# Patient Record
Sex: Male | Born: 1965 | Race: White | Hispanic: No | Marital: Married
Health system: Midwestern US, Community
[De-identification: ages and names within clinical notes are randomized; demographics above are authoritative.]

## PROBLEM LIST (undated history)

## (undated) DIAGNOSIS — I10 Essential (primary) hypertension: Secondary | ICD-10-CM

## (undated) DIAGNOSIS — G4730 Sleep apnea, unspecified: Secondary | ICD-10-CM

## (undated) DIAGNOSIS — E669 Obesity, unspecified: Secondary | ICD-10-CM

## (undated) DIAGNOSIS — E785 Hyperlipidemia, unspecified: Secondary | ICD-10-CM

## (undated) DIAGNOSIS — R739 Hyperglycemia, unspecified: Secondary | ICD-10-CM

## (undated) DIAGNOSIS — G4733 Obstructive sleep apnea (adult) (pediatric): Secondary | ICD-10-CM

## (undated) DIAGNOSIS — M1990 Unspecified osteoarthritis, unspecified site: Secondary | ICD-10-CM

## (undated) DIAGNOSIS — Z96651 Presence of right artificial knee joint: Secondary | ICD-10-CM

## (undated) DIAGNOSIS — M1711 Unilateral primary osteoarthritis, right knee: Secondary | ICD-10-CM

## (undated) HISTORY — DX: Essential (primary) hypertension: I10

## (undated) HISTORY — DX: Hyperglycemia, unspecified: R73.9

## (undated) HISTORY — PX: OTHER SURGICAL HISTORY: SHX169

## (undated) HISTORY — PX: TIBIA OSTEOTOMY: SHX1065

## (undated) HISTORY — DX: Hyperlipidemia, unspecified: E78.5

## (undated) HISTORY — PX: VASECTOMY: SHX75

## (undated) HISTORY — PX: APPENDECTOMY: SHX54

## (undated) HISTORY — PX: HIGH TIBIAL OSTEOTOMY: SHX667

## (undated) HISTORY — DX: Obstructive sleep apnea (adult) (pediatric): G47.33

## (undated) HISTORY — DX: Obesity, unspecified: E66.9

---

## 2003-12-23 ENCOUNTER — Emergency Department (HOSPITAL_COMMUNITY): Admission: EM | Admit: 2003-12-23 | Discharge: 2003-12-24 | Payer: Self-pay | Admitting: Emergency Medicine

## 2014-05-08 ENCOUNTER — Encounter: Payer: Self-pay | Admitting: *Deleted

## 2014-05-08 ENCOUNTER — Encounter: Payer: Self-pay | Admitting: Cardiovascular Disease

## 2014-05-08 ENCOUNTER — Ambulatory Visit (INDEPENDENT_AMBULATORY_CARE_PROVIDER_SITE_OTHER): Payer: BC Managed Care – PPO | Admitting: Cardiovascular Disease

## 2014-05-08 VITALS — BP 130/90 | HR 91 | Ht 73.0 in | Wt 286.0 lb

## 2014-05-08 DIAGNOSIS — E785 Hyperlipidemia, unspecified: Secondary | ICD-10-CM | POA: Insufficient documentation

## 2014-05-08 DIAGNOSIS — I10 Essential (primary) hypertension: Secondary | ICD-10-CM

## 2014-05-08 DIAGNOSIS — M1990 Unspecified osteoarthritis, unspecified site: Secondary | ICD-10-CM | POA: Insufficient documentation

## 2014-05-08 DIAGNOSIS — Z01810 Encounter for preprocedural cardiovascular examination: Secondary | ICD-10-CM

## 2014-05-08 DIAGNOSIS — E662 Morbid (severe) obesity with alveolar hypoventilation: Secondary | ICD-10-CM

## 2014-05-08 DIAGNOSIS — Z01818 Encounter for other preprocedural examination: Secondary | ICD-10-CM

## 2014-05-08 NOTE — Patient Instructions (Signed)
Your physician recommends that you schedule a follow-up appointment in: AS NEEDED  Your physician recommends that you continue on your current medications as directed. Please refer to the Current Medication list given to you today.  

## 2014-05-08 NOTE — Assessment & Plan Note (Signed)
Well controlled.  Continue current medications and low sodium Dash type diet.    

## 2014-05-08 NOTE — Assessment & Plan Note (Signed)
Base on guidelines for moderate risk surgery no testing needed.  Asymptomatic, normal exam and ECG Clear to have left TKR with Dr Charlann Boxerlin

## 2014-05-08 NOTE — Assessment & Plan Note (Signed)
Cholesterol is at goal.  Continue current dose of statin and diet Rx.  No myalgias or side effects.  F/U  LFT's in 6 months. No results found for this basename: LDLCALC  [facility]

## 2014-05-08 NOTE — Progress Notes (Signed)
Patient ID: Roberto ShutterChris Kim, male   DOB: 05/22/1966, 48 y.o.   MRN: [id number]    48 yo referred by Dr Charlann Boxerlin for preop clearence .  He has no history of arrhythmia, CAD, or CHF.  He has longstanding well Rx HTN and elevated lipids.  He is overweight and sedentary.  He has sleep apnea but does not where CPAP.  Has had surgeries before including appendectomy with no anesthetic or bleeding issues.  Compliant with meds No chest pain Functional dyspnea  Non smoker.  Works as a Nurse, [employer] plant manager.  Left knee has been bad for years and right also arthritic      ROS: Denies fever, malais, weight loss, blurry vision, decreased visual acuity, cough, sputum, SOB, hemoptysis, pleuritic pain, palpitaitons, heartburn, abdominal pain, melena, lower extremity edema, claudication, or rash.  All other systems reviewed and negative   General: Affect appropriate Healthy:  appears stated age HEENT: normal Neck supple with no adenopathy JVP normal no bruits no thyromegaly Lungs clear with no wheezing and good diaphragmatic motion Heart:  S1/S2 no murmur,rub, gallop or click PMI normal Abdomen: benighn, BS positve, no tenderness, no AAA no bruit.  No HSM or HJR Distal pulses intact with no bruits No edema Neuro non-focal Skin warm and dry No muscular weakness  Medications Current Outpatient Prescriptions  Medication Sig Dispense Refill  . CRESTOR 10 MG tablet Take 10 mg by mouth daily.      Marland Kitchen. losartan (COZAAR) 50 MG tablet Take 50 mg by mouth daily.      . naproxen sodium (ANAPROX) 220 MG tablet Take 440 mg by mouth.      . predniSONE (STERAPRED UNI-PAK) 5 MG TABS tablet Take 5 mg by mouth daily.      . sildenafil (REVATIO) 20 MG tablet Take 40 mg by mouth. As needed       No current facility-administered medications for this visit.    Allergies Ace inhibitors and Statins  Family History: Family History  Problem Relation Age of Onset  . Hypertension Mother   . Hypertension Father     Social  History: History   Social History  . Marital Status: Married    Spouse Name: N/A    Number of Children: N/A  . Years of Education: N/A   Occupational History  . Not on file.   Social History Main Topics  . Smoking status: Never Smoker   . Smokeless tobacco: Not on file  . Alcohol Use: Not on file  . Drug Use: Not on file  . Sexual Activity: Not on file   Other Topics Concern  . Not on file   Social History Narrative  . No narrative on file    Electrocardiogram:  NSR normal ECG   Assessment and Plan

## 2014-05-08 NOTE — Assessment & Plan Note (Signed)
Discussed portion control and low carb diet.  Exercise program post TKR  Issues of apnea and heart arrhythmia discussed f/u primary regarding CPAP although patient pretty clear he will not wear it

## 2014-05-14 ENCOUNTER — Encounter (HOSPITAL_COMMUNITY): Payer: Self-pay | Admitting: Pharmacy Technician

## 2014-05-14 NOTE — Patient Instructions (Addendum)
Roberto CapriceChristopher Kim  05/14/2014   Your procedure is scheduled on:  05/21/2014    Come thru the [facility] Entr[facility].   Follow the Signs to Short Stay Center at  0700      am  Call this number if you have problems the morning of surgery: [phone]   Remember:   Do not eat food or drink liquids after midnight.   Take these medicines the morning of surgery with A SIP OF WATER: Allergy eye drops if needed    Do not wear jewelry,   Do not wear lotions, powders, or perfumes.  deodorant.  . Men may shave face and neck.  Do not bring valuables to the hospital.  Contacts, dentures or bridgework may not be worn into surgery.  Leave suitcase in the car. After surgery it may be brought to your room.  For patients admitted to the hospital, checkout time is 11:00 AM the day of  discharge.       Please read over the following fact sheets that you were given: MRSA Information, coughing and deep breathing exercises, leg exercises            Roberto Kim - Preparing for Surgery Before surgery, you can play an important role.  Because skin is not sterile, your skin needs to be as free of germs as possible.  You can reduce the number of germs on your skin by washing with CHG (chlorahexidine gluconate) soap before surgery.  CHG is an antiseptic cleaner which kills germs and bonds with the skin to continue killing germs even after washing. Please DO NOT use if you have an allergy to CHG or antibacterial soaps.  If your skin becomes reddened/irritated stop using the CHG and inform your nurse when you arrive at Short Stay. Do not shave (including legs and underarms) for at least 48 hours prior to the first CHG shower.  You may shave your face/neck. Please follow these instructions carefully:  1.  Shower with CHG Soap the night before surgery and the  morning of Surgery.  2.  If you choose to wash your hair, wash your hair first as usual with your  normal  shampoo.  3.  After you shampoo, rinse your hair and  body thoroughly to remove the  shampoo.                           4.  Use CHG as you would any other liquid soap.  You can apply chg directly  to the skin and wash                       Gently with a scrungie or clean washcloth.  5.  Apply the CHG Soap to your body ONLY FROM THE NECK DOWN.   Do not use on face/ open                           Wound or open sores. Avoid contact with eyes, ears mouth and genitals (private parts).                       Wash face,  Genitals (private parts) with your normal soap.             6.  Wash thoroughly, paying special attention to the area where your surgery  will be performed.  7.  Thoroughly rinse your  body with warm water from the neck down.  8.  DO NOT shower/wash with your normal soap after using and rinsing off  the CHG Soap.                9.  Pat yourself dry with a clean towel.            10.  Wear clean pajamas.            11.  Place clean sheets on your bed the night of your first shower and do not  sleep with pets. Day of Surgery : Do not apply any lotions/deodorants the morning of surgery.  Please wear clean clothes to the hospital/surgery center.  FAILURE TO FOLLOW THESE INSTRUCTIONS MAY RESULT IN THE CANCELLATION OF YOUR SURGERY PATIENT SIGNATURE_________________________________  NURSE SIGNATURE__________________________________  ________________________________________________________________________  WHAT IS A BLOOD TRANSFUSION? Blood Transfusion Information  A transfusion is the replacement of blood or some of its parts. Blood is made up of multiple cells which provide different functions.  Red blood cells carry oxygen and are used for blood loss replacement.  White blood cells fight against infection.  Platelets control bleeding.  Plasma helps clot blood.  Other blood products are available for specialized needs, such as hemophilia or other clotting disorders. BEFORE THE TRANSFUSION  Who gives blood for transfusions?    Healthy volunteers who are fully evaluated to make sure their blood is safe. This is blood bank blood. Transfusion therapy is the safest it has ever been in the practice of medicine. Before blood is taken from a donor, a complete history is taken to make sure that person has no history of diseases nor engages in risky social behavior (examples are intravenous drug use or sexual activity with multiple partners). The donor's travel history is screened to minimize risk of transmitting infections, such as malaria. The donated blood is tested for signs of infectious diseases, such as HIV and hepatitis. The blood is then tested to be sure it is compatible with you in order to minimize the chance of a transfusion reaction. If you or a relative donates blood, this is often done in anticipation of surgery and is not appropriate for emergency situations. It takes many days to process the donated blood. RISKS AND COMPLICATIONS Although transfusion therapy is very safe and saves many lives, the main dangers of transfusion include:   Getting an infectious disease.  Developing a transfusion reaction. This is an allergic reaction to something in the blood you were given. Every precaution is taken to prevent this. The decision to have a blood transfusion has been considered carefully by your caregiver before blood is given. Blood is not given unless the benefits outweigh the risks. AFTER THE TRANSFUSION  Right after receiving a blood transfusion, you will usually feel much better and more energetic. This is especially true if your red blood cells have gotten low (anemic). The transfusion raises the level of the red blood cells which carry oxygen, and this usually causes an energy increase.  The nurse administering the transfusion will monitor you carefully for complications. HOME CARE INSTRUCTIONS  No special instructions are needed after a transfusion. You may find your energy is better. Speak with your  caregiver about any limitations on activity for underlying diseases you may have. SEEK MEDICAL CARE IF:   Your condition is not improving after your transfusion.  You develop redness or irritation at the intravenous (IV) site. SEEK IMMEDIATE MEDICAL CARE IF:  Any of the following symptoms  occur over the next 12 hours:  Shaking chills.  You have a temperature by mouth above 102 F (38.9 C), not controlled by medicine.  Chest, back, or muscle pain.  People around you feel you are not acting correctly or are confused.  Shortness of breath or difficulty breathing.  Dizziness and fainting.  You get a rash or develop hives.  You have a decrease in urine output.  Your urine turns a dark color or changes to pink, red, or brown. Any of the following symptoms occur over the next 10 days:  You have a temperature by mouth above 102 F (38.9 C), not controlled by medicine.  Shortness of breath.  Weakness after normal activity.  The white part of the eye turns yellow (jaundice).  You have a decrease in the amount of urine or are urinating less often.  Your urine turns a dark color or changes to pink, red, or brown. Document Released: 07/02/2000 Document Revised: 09/27/2011 Document Reviewed: 02/19/2008 ExitCare Patient Information 2014 [place], [place].  _______________________________________________________________________  Incentive Spirometer  An incentive spirometer is a tool that can help keep your lungs clear and active. This tool measures how well you are filling your lungs with each breath. Taking long deep breaths may help reverse or decrease the chance of developing breathing (pulmonary) problems (especially infection) following:  A long period of time when you are unable to move or be active. BEFORE THE PROCEDURE   If the spirometer includes an indicator to show your best effort, your nurse or respiratory therapist will set it to a desired goal.  If possible, sit  up straight or lean slightly forward. Try not to slouch.  Hold the incentive spirometer in an upright position. INSTRUCTIONS FOR USE  1. Sit on the edge of your bed if possible, or sit up as far as you can in bed or on a chair. 2. Hold the incentive spirometer in an upright position. 3. Breathe out normally. 4. Place the mouthpiece in your mouth and seal your lips tightly around it. 5. Breathe in slowly and as deeply as possible, raising the piston or the ball toward the top of the column. 6. Hold your breath for 3-5 seconds or for as long as possible. Allow the piston or ball to fall to the bottom of the column. 7. Remove the mouthpiece from your mouth and breathe out normally. 8. Rest for a few seconds and repeat Steps 1 through 7 at least 10 times every 1-2 hours when you are awake. Take your time and take a few normal breaths between deep breaths. 9. The spirometer may include an indicator to show your best effort. Use the indicator as a goal to work toward during each repetition. 10. After each set of 10 deep breaths, practice coughing to be sure your lungs are clear. If you have an incision (the cut made at the time of surgery), support your incision when coughing by placing a pillow or rolled up towels firmly against it. Once you are able to get out of bed, walk around indoors and cough well. You may stop using the incentive spirometer when instructed by your caregiver.  RISKS AND COMPLICATIONS  Take your time so you do not get dizzy or light-headed.  If you are in pain, you may need to take or ask for pain medication before doing incentive spirometry. It is harder to take a deep breath if you are having pain. AFTER USE  Rest and breathe slowly and easily.  It  can be helpful to keep track of a log of your progress. Your caregiver can provide you with a simple table to help with this. If you are using the spirometer at home, follow these instructions: SEEK MEDICAL CARE IF:   You are  having difficultly using the spirometer.  You have trouble using the spirometer as often as instructed.  Your pain medication is not giving enough relief while using the spirometer.  You develop fever of 100.5 F (38.1 C) or higher. SEEK IMMEDIATE MEDICAL CARE IF:   You cough up bloody sputum that had not been present before.  You develop fever of 102 F (38.9 C) or greater.  You develop worsening pain at or near the incision site. MAKE SURE YOU:   Understand these instructions.  Will watch your condition.  Will get help right away if you are not doing well or get worse. Document Released: 11/15/2006 Document Revised: 09/27/2011 Document Reviewed: 01/16/2007 [facility] Patient Information 2014 [place], [place].   ________________________________________________________________________

## 2014-05-15 ENCOUNTER — Encounter (HOSPITAL_COMMUNITY)
Admission: RE | Admit: 2014-05-15 | Discharge: 2014-05-15 | Disposition: A | Discharge status: Home / Self Care | Payer: BC Managed Care – PPO | Source: Ambulatory Visit | Attending: Orthopedic Surgery | Admitting: Orthopedic Surgery

## 2014-05-15 ENCOUNTER — Encounter (HOSPITAL_COMMUNITY): Payer: Self-pay

## 2014-05-15 ENCOUNTER — Ambulatory Visit (HOSPITAL_COMMUNITY)
Admission: RE | Admit: 2014-05-15 | Discharge: 2014-05-15 | Disposition: A | Discharge status: Home / Self Care | Payer: BC Managed Care – PPO | Source: Ambulatory Visit | Attending: Orthopedic Surgery | Admitting: Orthopedic Surgery

## 2014-05-15 ENCOUNTER — Encounter (INDEPENDENT_AMBULATORY_CARE_PROVIDER_SITE_OTHER): Payer: Self-pay

## 2014-05-15 DIAGNOSIS — I10 Essential (primary) hypertension: Secondary | ICD-10-CM | POA: Diagnosis not present

## 2014-05-15 DIAGNOSIS — Z01810 Encounter for preprocedural cardiovascular examination: Secondary | ICD-10-CM | POA: Insufficient documentation

## 2014-05-15 HISTORY — DX: Essential (primary) hypertension: I10

## 2014-05-15 HISTORY — DX: Sleep apnea, unspecified: G47.30

## 2014-05-15 HISTORY — DX: Unspecified osteoarthritis, unspecified site: M19.90

## 2014-05-15 LAB — PROTIME-INR
INR: 0.95 (ref 0.00–1.49)
Prothrombin Time: 12.8 s (ref 11.6–15.2)

## 2014-05-15 LAB — ABO/RH: ABO/RH(D): A POS

## 2014-05-15 LAB — SURGICAL PCR SCREEN
MRSA, PCR: NEGATIVE
STAPHYLOCOCCUS AUREUS: NEGATIVE

## 2014-05-15 LAB — APTT: aPTT: 27 s (ref 24–37)

## 2014-05-15 NOTE — Progress Notes (Signed)
EKG and CXR done 05/15/2014 EPIC

## 2014-05-15 NOTE — Progress Notes (Signed)
LOV 04/23/14 on chart from Dr Trinna PostAlex Radiotchenko on chart along with labs of CMP, Urinalysis, CBC done 04/23/2014.

## 2014-05-19 NOTE — H&P (Signed)
TOTAL KNEE ADMISSION H&P  Patient is being admitted for left total knee arthroplasty.  Subjective:  Chief Complaint:    Left knee primary OA / pain.  HPI: Roberto CapriceChristopher Kim, 48 y.o. male, has a history of pain and functional disability in the left knee due to arthritis and has failed non-surgical conservative treatments for greater than 12 weeks to includeNSAID's and/or analgesics, corticosteriod injections, viscosupplementation injections, use of assistive devices and activity modification.  Onset of symptoms was gradual, starting 20+ years ago with gradually worsening course since that time. The patient noted prior procedures on the knee to include  osteotomy on the left knee(s).  Patient currently rates pain in the left knee(s) at 8 out of 10 with activity. Patient has night pain, worsening of pain with activity and weight bearing, pain that interferes with activities of daily living, pain with passive range of motion, crepitus and joint swelling.  Patient has evidence of periarticular osteophytes and joint space narrowing by imaging studies.  There is no active infection.  Risks, benefits and expectations were discussed with the patient.  Risks including but not limited to the risk of anesthesia, blood clots, nerve damage, blood vessel damage, failure of the prosthesis, infection and up to and including death.  Patient understand the risks, benefits and expectations and wishes to proceed with surgery.   PCP: Verl BangsADIONTCHENKO, ALEXEI, MD  D/C Plans:      Home with HHPT  Post-op Meds:       No Rx given  Tranexamic Acid:      To be given - IV    Decadron:      Is to be given  FYI:     ASA post-op  Norco post-op   Past Medical History  Diagnosis Date  . Hypertension   . Sleep apnea     no cpap   . Arthritis     Past Surgical History  Procedure Laterality Date  . Pins in right hand     . Tibia osteotomy      left knee   . Vasectomy    . Appendectomy      No prescriptions prior to  admission   Allergies  Allergen Reactions  . Ace Inhibitors     cough    History  Substance Use Topics  . Smoking status: Never Smoker   . Smokeless tobacco: Never Used  . Alcohol Use: Yes     Comment: occasional     No family history on file.   Review of Systems  Constitutional: Negative.   HENT: Negative.   Eyes: Negative.   Respiratory: Negative.   Cardiovascular: Negative.   Gastrointestinal: Negative.   Genitourinary: Negative.   Musculoskeletal: Positive for joint pain.  Skin: Negative.   Neurological: Negative.   Endo/Heme/Allergies: Negative.   Psychiatric/Behavioral: Negative.     Objective:  Physical Exam  Constitutional: He is oriented to person, place, and time. He appears well-developed and well-nourished.  HENT:  Head: Normocephalic and atraumatic.  Eyes: Pupils are equal, round, and reactive to light.  Neck: Neck supple. No JVD present. No tracheal deviation present. No thyromegaly present.  Cardiovascular: Normal rate, regular rhythm, normal heart sounds and intact distal pulses.   Respiratory: Effort normal and breath sounds normal. No stridor. No respiratory distress. He has no wheezes.  GI: Soft. There is no tenderness. There is no guarding.  Musculoskeletal:       Left knee: He exhibits decreased range of motion, swelling and bony tenderness. He exhibits no  ecchymosis, no deformity, no laceration and no erythema. Tenderness found.  Lymphadenopathy:    He has no cervical adenopathy.  Neurological: He is alert and oriented to person, place, and time.  Skin: Skin is warm and dry.  Psychiatric: He has a normal mood and affect.      Imaging Review Plain radiographs demonstrate severe degenerative joint disease of the left knee(s). The overall alignment is neutral. The bone quality appears to be good for age and reported activity level.  Assessment/Plan:  End stage arthritis, left knee   The patient history, physical examination, clinical  judgment of the provider and imaging studies are consistent with end stage degenerative joint disease of the left knee(s) and total knee arthroplasty is deemed medically necessary. The treatment options including medical management, injection therapy arthroscopy and arthroplasty were discussed at length. The risks and benefits of total knee arthroplasty were presented and reviewed. The risks due to aseptic loosening, infection, stiffness, patella tracking problems, thromboembolic complications and other imponderables were discussed. The patient acknowledged the explanation, agreed to proceed with the plan and consent was signed. Patient is being admitted for inpatient treatment for surgery, pain control, PT, OT, prophylactic antibiotics, VTE prophylaxis, progressive ambulation and ADL's and discharge planning. The patient is planning to be discharged home with home health services.     Roberto AuerbachMatthew S. Canisha Issac   PA-C  05/19/2014, 7:40 PM

## 2014-05-20 ENCOUNTER — Encounter: Payer: Self-pay | Admitting: Cardiovascular Disease

## 2014-05-20 MED ORDER — CEFAZOLIN SODIUM 10 G IJ SOLR
3.0000 g | INTRAMUSCULAR | Status: AC
  Administered 2014-05-21: 3 g via INTRAVENOUS
  Filled 2014-05-20: qty 3000

## 2014-05-20 NOTE — Anesthesia Preprocedure Evaluation (Addendum)
Anesthesia Evaluation  Patient identified by MRN, date of birth, ID band Patient awake    Reviewed: Allergy & Precautions, H&P , NPO status , Patient's Chart, lab work & pertinent test results  History of Anesthesia Complications Negative for: history of anesthetic complications  Airway Mallampati: II  TM Distance: >3 FB Neck ROM: Full    Dental  (+) Teeth Intact, Dental Advisory Given   Pulmonary sleep apnea ,  breath sounds clear to auscultation  Pulmonary exam normal       Cardiovascular Exercise Tolerance: Good hypertension, Pt. on medications Rhythm:Regular Rate:Normal     Neuro/Psych negative neurological ROS  negative psych ROS   GI/Hepatic negative GI ROS, Neg liver ROS,   Endo/Other  Morbid obesity  Renal/GU negative Renal ROS  negative genitourinary   Musculoskeletal  (+) Arthritis -, Osteoarthritis,    Abdominal   Peds negative pediatric ROS (+)  Hematology negative hematology ROS (+)   Anesthesia Other Findings   Reproductive/Obstetrics negative OB ROS                            Anesthesia Physical Anesthesia Plan  ASA: II  Anesthesia Plan: Spinal   Post-op Pain Management:    Induction: Intravenous  Airway Management Planned: Nasal Cannula  Additional Equipment:   Intra-op Plan:   Post-operative Plan: Extubation in OR  Informed Consent: I have reviewed the patients History and Physical, chart, labs and discussed the procedure including the risks, benefits and alternatives for the proposed anesthesia with the patient or authorized representative who has indicated his/her understanding and acceptance.   Dental advisory given  Plan Discussed with: CRNA  Anesthesia Plan Comments: (CBC reviewed on chart, plts 207)       Anesthesia Quick Evaluation

## 2014-05-21 ENCOUNTER — Inpatient Hospital Stay (HOSPITAL_COMMUNITY)
Admission: RE | Admit: 2014-05-21 | Discharge: 2014-05-22 | DRG: 470 | Disposition: A | Discharge status: Home Health | Payer: BC Managed Care – PPO | Source: Ambulatory Visit | Attending: Orthopedic Surgery | Admitting: Orthopedic Surgery

## 2014-05-21 ENCOUNTER — Inpatient Hospital Stay (HOSPITAL_COMMUNITY): Payer: BC Managed Care – PPO | Admitting: Anesthesiology

## 2014-05-21 ENCOUNTER — Encounter (HOSPITAL_COMMUNITY)
Admission: RE | Disposition: A | Discharge status: Home Health | Payer: Self-pay | Source: Ambulatory Visit | Attending: Orthopedic Surgery

## 2014-05-21 ENCOUNTER — Encounter (HOSPITAL_COMMUNITY): Payer: Self-pay | Admitting: *Deleted

## 2014-05-21 DIAGNOSIS — M1712 Unilateral primary osteoarthritis, left knee: Secondary | ICD-10-CM | POA: Diagnosis present

## 2014-05-21 DIAGNOSIS — M659 Synovitis and tenosynovitis, unspecified: Secondary | ICD-10-CM | POA: Diagnosis present

## 2014-05-21 DIAGNOSIS — E669 Obesity, unspecified: Secondary | ICD-10-CM | POA: Diagnosis present

## 2014-05-21 DIAGNOSIS — M25562 Pain in left knee: Secondary | ICD-10-CM | POA: Diagnosis present

## 2014-05-21 DIAGNOSIS — Z6837 Body mass index (BMI) 37.0-37.9, adult: Secondary | ICD-10-CM

## 2014-05-21 DIAGNOSIS — Z96652 Presence of left artificial knee joint: Secondary | ICD-10-CM

## 2014-05-21 DIAGNOSIS — Z96659 Presence of unspecified artificial knee joint: Secondary | ICD-10-CM

## 2014-05-21 HISTORY — PX: TOTAL KNEE ARTHROPLASTY: SHX125

## 2014-05-21 LAB — TYPE AND SCREEN
ABO/RH(D): A POS
Antibody Screen: NEGATIVE

## 2014-05-21 SURGERY — ARTHROPLASTY, KNEE, TOTAL
Anesthesia: Spinal | Site: Knee | Laterality: Left

## 2014-05-21 MED ORDER — POLYETHYLENE GLYCOL 3350 17 G PO PACK
17.0000 g | PACK | Freq: Two times a day (BID) | ORAL | Status: DC
  Administered 2014-05-22: 17 g via ORAL

## 2014-05-21 MED ORDER — CEFAZOLIN SODIUM-DEXTROSE 2-3 GM-% IV SOLR
2.0000 g | Freq: Four times a day (QID) | INTRAVENOUS | Status: AC
  Administered 2014-05-21 (×2): 2 g via INTRAVENOUS
  Filled 2014-05-21 (×2): qty 50

## 2014-05-21 MED ORDER — HYDROMORPHONE HCL 1 MG/ML IJ SOLN
0.5000 mg | INTRAMUSCULAR | Status: DC | PRN
  Administered 2014-05-21: 1 mg via INTRAVENOUS
  Administered 2014-05-22: 0.5 mg via INTRAVENOUS
  Filled 2014-05-21 (×2): qty 1

## 2014-05-21 MED ORDER — BUPIVACAINE IN DEXTROSE 0.75-8.25 % IT SOLN
INTRATHECAL | Status: DC | PRN
  Administered 2014-05-21: 2 mL via INTRATHECAL

## 2014-05-21 MED ORDER — TRANEXAMIC ACID 100 MG/ML IV SOLN
1000.0000 mg | Freq: Once | INTRAVENOUS | Status: AC
  Administered 2014-05-21: 1000 mg via INTRAVENOUS
  Filled 2014-05-21: qty 10

## 2014-05-21 MED ORDER — BUPIVACAINE-EPINEPHRINE (PF) 0.25% -1:200000 IJ SOLN
INTRAMUSCULAR | Status: AC
  Filled 2014-05-21: qty 30

## 2014-05-21 MED ORDER — BISACODYL 10 MG RE SUPP: 10.0000 mg | Freq: Every day | RECTAL | Status: DC | PRN

## 2014-05-21 MED ORDER — SODIUM CHLORIDE 0.9 % IR SOLN
Status: DC | PRN
  Administered 2014-05-21: 1000 mL

## 2014-05-21 MED ORDER — FENTANYL CITRATE 0.05 MG/ML IJ SOLN: 25.0000 ug | INTRAMUSCULAR | Status: DC | PRN

## 2014-05-21 MED ORDER — DEXAMETHASONE SODIUM PHOSPHATE 10 MG/ML IJ SOLN
10.0000 mg | Freq: Once | INTRAMUSCULAR | Status: AC
  Administered 2014-05-22: 10 mg via INTRAVENOUS
  Filled 2014-05-21: qty 1

## 2014-05-21 MED ORDER — 0.9 % SODIUM CHLORIDE (POUR BTL) OPTIME
TOPICAL | Status: DC | PRN
  Administered 2014-05-21: 1000 mL

## 2014-05-21 MED ORDER — KETOROLAC TROMETHAMINE 30 MG/ML IJ SOLN
INTRAMUSCULAR | Status: AC
Start: 2014-05-21 — End: 2014-05-21
  Filled 2014-05-21: qty 1

## 2014-05-21 MED ORDER — ONDANSETRON HCL 4 MG/2ML IJ SOLN: 4.0000 mg | Freq: Once | INTRAMUSCULAR | Status: DC | PRN

## 2014-05-21 MED ORDER — ONDANSETRON HCL 4 MG/2ML IJ SOLN
INTRAMUSCULAR | Status: DC | PRN
  Administered 2014-05-21: 4 mg via INTRAVENOUS

## 2014-05-21 MED ORDER — FLUTICASONE PROPIONATE 50 MCG/ACT NA SUSP
1.0000 | Freq: Every day | NASAL | Status: DC
  Administered 2014-05-21: 1 via NASAL
  Filled 2014-05-21: qty 16

## 2014-05-21 MED ORDER — CHLORHEXIDINE GLUCONATE 4 % EX LIQD: 60.0000 mL | Freq: Once | CUTANEOUS | Status: DC

## 2014-05-21 MED ORDER — DEXAMETHASONE SODIUM PHOSPHATE 10 MG/ML IJ SOLN
10.0000 mg | Freq: Once | INTRAMUSCULAR | Status: AC
  Administered 2014-05-21: 10 mg via INTRAVENOUS

## 2014-05-21 MED ORDER — METHOCARBAMOL 500 MG PO TABS
500.0000 mg | ORAL_TABLET | Freq: Four times a day (QID) | ORAL | Status: DC | PRN
  Administered 2014-05-21: 500 mg via ORAL
  Filled 2014-05-21: qty 1

## 2014-05-21 MED ORDER — MENTHOL 3 MG MT LOZG
1.0000 | LOZENGE | OROMUCOSAL | Status: DC | PRN
  Filled 2014-05-21: qty 9

## 2014-05-21 MED ORDER — BUPIVACAINE-EPINEPHRINE (PF) 0.25% -1:200000 IJ SOLN
INTRAMUSCULAR | Status: DC | PRN
  Administered 2014-05-21: 30 mL

## 2014-05-21 MED ORDER — LACTATED RINGERS IV SOLN
INTRAVENOUS | Status: DC | PRN
  Administered 2014-05-21 (×3): via INTRAVENOUS

## 2014-05-21 MED ORDER — FERROUS SULFATE 325 (65 FE) MG PO TABS
325.0000 mg | ORAL_TABLET | Freq: Three times a day (TID) | ORAL | Status: DC
  Administered 2014-05-21 – 2014-05-22 (×2): 325 mg via ORAL
  Filled 2014-05-21 (×5): qty 1

## 2014-05-21 MED ORDER — DIPHENHYDRAMINE HCL 25 MG PO CAPS: 25.0000 mg | ORAL_CAPSULE | Freq: Four times a day (QID) | ORAL | Status: DC | PRN

## 2014-05-21 MED ORDER — LOSARTAN POTASSIUM 50 MG PO TABS
50.0000 mg | ORAL_TABLET | Freq: Every day | ORAL | Status: DC
  Filled 2014-05-21 (×2): qty 1

## 2014-05-21 MED ORDER — MIDAZOLAM HCL 5 MG/5ML IJ SOLN
INTRAMUSCULAR | Status: DC | PRN
  Administered 2014-05-21: 2 mg via INTRAVENOUS

## 2014-05-21 MED ORDER — ASPIRIN EC 325 MG PO TBEC
325.0000 mg | DELAYED_RELEASE_TABLET | Freq: Two times a day (BID) | ORAL | Status: DC
  Administered 2014-05-22: 325 mg via ORAL
  Filled 2014-05-21 (×3): qty 1

## 2014-05-21 MED ORDER — SODIUM CHLORIDE 0.9 % IJ SOLN
INTRAMUSCULAR | Status: DC | PRN
  Administered 2014-05-21: 30 mL

## 2014-05-21 MED ORDER — PROPOFOL INFUSION 10 MG/ML OPTIME
INTRAVENOUS | Status: DC | PRN
  Administered 2014-05-21: 75 ug/kg/min via INTRAVENOUS

## 2014-05-21 MED ORDER — METOCLOPRAMIDE HCL 5 MG/ML IJ SOLN
5.0000 mg | Freq: Three times a day (TID) | INTRAMUSCULAR | Status: DC | PRN
  Administered 2014-05-22: 10 mg via INTRAVENOUS
  Filled 2014-05-21: qty 2

## 2014-05-21 MED ORDER — DOCUSATE SODIUM 100 MG PO CAPS
100.0000 mg | ORAL_CAPSULE | Freq: Two times a day (BID) | ORAL | Status: DC
  Administered 2014-05-21 – 2014-05-22 (×2): 100 mg via ORAL

## 2014-05-21 MED ORDER — SODIUM CHLORIDE 0.9 % IJ SOLN
INTRAMUSCULAR | Status: AC
  Filled 2014-05-21: qty 50

## 2014-05-21 MED ORDER — PHENOL 1.4 % MT LIQD
1.0000 | OROMUCOSAL | Status: DC | PRN
  Filled 2014-05-21: qty 177

## 2014-05-21 MED ORDER — FENTANYL CITRATE 0.05 MG/ML IJ SOLN
INTRAMUSCULAR | Status: DC | PRN
  Administered 2014-05-21 (×2): 50 ug via INTRAVENOUS

## 2014-05-21 MED ORDER — PHENYLEPHRINE HCL 10 MG/ML IJ SOLN
INTRAMUSCULAR | Status: DC | PRN
  Administered 2014-05-21 (×2): 40 ug via INTRAVENOUS

## 2014-05-21 MED ORDER — KETOROLAC TROMETHAMINE 30 MG/ML IJ SOLN
INTRAMUSCULAR | Status: DC | PRN
  Administered 2014-05-21: 30 mg

## 2014-05-21 MED ORDER — LACTATED RINGERS IV SOLN: INTRAVENOUS | Status: DC

## 2014-05-21 MED ORDER — PROPOFOL 10 MG/ML IV BOLUS
INTRAVENOUS | Status: DC | PRN
  Administered 2014-05-21 (×4): 20 mg via INTRAVENOUS

## 2014-05-21 MED ORDER — HYDROMORPHONE HCL 1 MG/ML IJ SOLN
INTRAMUSCULAR | Status: AC
  Administered 2014-05-21: 0.5 mg
  Filled 2014-05-21: qty 1

## 2014-05-21 MED ORDER — ONDANSETRON HCL 4 MG/2ML IJ SOLN
4.0000 mg | Freq: Four times a day (QID) | INTRAMUSCULAR | Status: DC | PRN
  Filled 2014-05-21: qty 2

## 2014-05-21 MED ORDER — METHOCARBAMOL 1000 MG/10ML IJ SOLN
500.0000 mg | Freq: Four times a day (QID) | INTRAVENOUS | Status: DC | PRN
  Administered 2014-05-21: 500 mg via INTRAVENOUS
  Filled 2014-05-21 (×2): qty 5

## 2014-05-21 MED ORDER — ONDANSETRON HCL 4 MG PO TABS: 4.0000 mg | ORAL_TABLET | Freq: Four times a day (QID) | ORAL | Status: DC | PRN

## 2014-05-21 MED ORDER — POTASSIUM CHLORIDE 2 MEQ/ML IV SOLN
INTRAVENOUS | Status: DC
  Administered 2014-05-21: 15:00:00 via INTRAVENOUS
  Filled 2014-05-21 (×8): qty 1000

## 2014-05-21 MED ORDER — MAGNESIUM CITRATE PO SOLN: 1.0000 | Freq: Once | ORAL | Status: AC | PRN

## 2014-05-21 MED ORDER — ALUM & MAG HYDROXIDE-SIMETH 200-200-20 MG/5ML PO SUSP: 30.0000 mL | ORAL | Status: DC | PRN

## 2014-05-21 MED ORDER — ROSUVASTATIN CALCIUM 10 MG PO TABS
10.0000 mg | ORAL_TABLET | Freq: Every day | ORAL | Status: DC
  Administered 2014-05-21 – 2014-05-22 (×2): 10 mg via ORAL
  Filled 2014-05-21 (×2): qty 1

## 2014-05-21 MED ORDER — METOCLOPRAMIDE HCL 10 MG PO TABS: 5.0000 mg | ORAL_TABLET | Freq: Three times a day (TID) | ORAL | Status: DC | PRN

## 2014-05-21 MED ORDER — CELECOXIB 200 MG PO CAPS
200.0000 mg | ORAL_CAPSULE | Freq: Two times a day (BID) | ORAL | Status: DC
  Administered 2014-05-21 – 2014-05-22 (×3): 200 mg via ORAL
  Filled 2014-05-21 (×4): qty 1

## 2014-05-21 MED ORDER — HYDROCODONE-ACETAMINOPHEN 7.5-325 MG PO TABS
1.0000 | ORAL_TABLET | ORAL | Status: DC
  Administered 2014-05-21: 2 via ORAL
  Administered 2014-05-21 – 2014-05-22 (×3): 1 via ORAL
  Administered 2014-05-22 (×2): 2 via ORAL
  Filled 2014-05-21 (×2): qty 2
  Filled 2014-05-21: qty 1
  Filled 2014-05-21: qty 2
  Filled 2014-05-21 (×2): qty 1

## 2014-05-21 SURGICAL SUPPLY — 58 items
BAG ZIPLOCK 12X15 (MISCELLANEOUS) IMPLANT
BANDAGE ELASTIC 6 VELCRO ST LF (GAUZE/BANDAGES/DRESSINGS) ×2 IMPLANT
BANDAGE ESMARK 6X9 LF (GAUZE/BANDAGES/DRESSINGS) ×1 IMPLANT
BLADE SAW SGTL 13.0X1.19X90.0M (BLADE) ×2 IMPLANT
BNDG ESMARK 6X9 LF (GAUZE/BANDAGES/DRESSINGS) ×2
BOWL SMART MIX CTS (DISPOSABLE) ×2 IMPLANT
CAPT RP KNEE ×2 IMPLANT
CEMENT HV SMART SET (Cement) ×4 IMPLANT
CUFF TOURN SGL QUICK 34 (TOURNIQUET CUFF) ×1
CUFF TRNQT CYL 34X4X40X1 (TOURNIQUET CUFF) ×1 IMPLANT
DECANTER SPIKE VIAL GLASS SM (MISCELLANEOUS) ×2 IMPLANT
DRAPE EXTREMITY TIBURON (DRAPES) ×2 IMPLANT
DRAPE POUCH INSTRU U-SHP 10X18 (DRAPES) ×2 IMPLANT
DRAPE U-SHAPE 47X51 STRL (DRAPES) ×2 IMPLANT
DRSG AQUACEL AG ADV 3.5X10 (GAUZE/BANDAGES/DRESSINGS) IMPLANT
DRSG AQUACEL AG ADV 3.5X14 (GAUZE/BANDAGES/DRESSINGS) ×2 IMPLANT
DURAPREP 26ML APPLICATOR (WOUND CARE) ×4 IMPLANT
ELECT REM PT RETURN 9FT ADLT (ELECTROSURGICAL) ×2
ELECTRODE REM PT RTRN 9FT ADLT (ELECTROSURGICAL) ×1 IMPLANT
FACESHIELD WRAPAROUND (MASK) ×8 IMPLANT
GLOVE BIO SURGEON STRL SZ7 (GLOVE) ×2 IMPLANT
GLOVE BIOGEL PI IND STRL 6.5 (GLOVE) ×2 IMPLANT
GLOVE BIOGEL PI IND STRL 7.0 (GLOVE) ×1 IMPLANT
GLOVE BIOGEL PI IND STRL 7.5 (GLOVE) ×1 IMPLANT
GLOVE BIOGEL PI IND STRL 8.5 (GLOVE) ×1 IMPLANT
GLOVE BIOGEL PI INDICATOR 6.5 (GLOVE) ×2
GLOVE BIOGEL PI INDICATOR 7.0 (GLOVE) ×1
GLOVE BIOGEL PI INDICATOR 7.5 (GLOVE) ×1
GLOVE BIOGEL PI INDICATOR 8.5 (GLOVE) ×1
GLOVE ECLIPSE 8.0 STRL XLNG CF (GLOVE) ×4 IMPLANT
GLOVE ORTHO TXT STRL SZ7.5 (GLOVE) ×4 IMPLANT
GLOVE SURG SS PI 7.0 STRL IVOR (GLOVE) ×2 IMPLANT
GOWN SPEC L3 XXLG W/TWL (GOWN DISPOSABLE) ×2 IMPLANT
GOWN STRL REUS W/TWL LRG LVL3 (GOWN DISPOSABLE) ×2 IMPLANT
GOWN STRL REUS W/TWL XL LVL3 (GOWN DISPOSABLE) ×2 IMPLANT
HANDPIECE INTERPULSE COAX TIP (DISPOSABLE) ×1
KIT BASIN OR (CUSTOM PROCEDURE TRAY) ×2 IMPLANT
LIQUID BAND (GAUZE/BANDAGES/DRESSINGS) ×2 IMPLANT
MANIFOLD NEPTUNE II (INSTRUMENTS) ×2 IMPLANT
NDL SAFETY ECLIPSE 18X1.5 (NEEDLE) ×1 IMPLANT
NEEDLE HYPO 18GX1.5 SHARP (NEEDLE) ×1
PACK TOTAL JOINT (CUSTOM PROCEDURE TRAY) ×2 IMPLANT
POSITIONER SURGICAL ARM (MISCELLANEOUS) ×2 IMPLANT
SET HNDPC FAN SPRY TIP SCT (DISPOSABLE) ×1 IMPLANT
SET PAD KNEE POSITIONER (MISCELLANEOUS) ×2 IMPLANT
SUCTION FRAZIER 12FR DISP (SUCTIONS) ×2 IMPLANT
SUT MNCRL AB 3-0 PS2 18 (SUTURE) ×2 IMPLANT
SUT MNCRL AB 4-0 PS2 18 (SUTURE) IMPLANT
SUT VIC AB 1 CT1 36 (SUTURE) ×4 IMPLANT
SUT VIC AB 2-0 CT1 27 (SUTURE) ×4
SUT VIC AB 2-0 CT1 TAPERPNT 27 (SUTURE) ×4 IMPLANT
SUT VLOC 180 0 24IN GS25 (SUTURE) ×2 IMPLANT
SYR 50ML LL SCALE MARK (SYRINGE) ×2 IMPLANT
TOWEL OR 17X26 10 PK STRL BLUE (TOWEL DISPOSABLE) ×2 IMPLANT
TOWEL OR NON WOVEN STRL DISP B (DISPOSABLE) ×2 IMPLANT
TRAY FOLEY CATH 16FRSI W/METER (SET/KITS/TRAYS/PACK) ×2 IMPLANT
WATER STERILE IRR 1500ML POUR (IV SOLUTION) ×2 IMPLANT
WRAP KNEE MAXI GEL POST OP (GAUZE/BANDAGES/DRESSINGS) ×2 IMPLANT

## 2014-05-21 NOTE — Op Note (Signed)
NAME:  Roberto Kim                      MEDICAL RECORD NO.:  [id number]                             FACILITY:  [facility]      PHYSICIAN:  Madlyn Frankel. Charlann Boxer, M.D.  DATE OF BIRTH:  27-Jun-1966      DATE OF PROCEDURE:  05/21/2014                                     OPERATIVE REPORT         PREOPERATIVE DIAGNOSIS:  Left knee primary osteoarthritis.      POSTOPERATIVE DIAGNOSIS:  Left knee primaryosteoarthritis.      FINDINGS:  The patient was noted to have complete loss of cartilage and   bone-on-bone arthritis with associated osteophytes in the medial and patellofemoral compartments of   the knee with a significant synovitis and associated effusion.      PROCEDURE:  1. Left total knee replacement.  2. Removal of deep implant     COMPONENTS USED:  DePuy rotating platform posterior stabilized knee   system, a size 4 standard femur, 4 tibia, 15 mm PS insert, and 41 patellar   button.      SURGEON:  Madlyn Frankel. Charlann Boxer, M.D.      ASSISTANT:  Lanney Gins, PA-C.      ANESTHESIA:  Spinal.      SPECIMENS:  None.      COMPLICATION:  None.      DRAINS:  None.  EBL: <150cc      TOURNIQUET TIME:   Total Tourniquet Time Documented: Thigh (Left) - 36 minutes Total: Thigh (Left) - 36 minutes  .      The patient was stable to the recovery room.      INDICATION FOR PROCEDURE:  [name] is a 48 y.o. male patient of   mine.  The patient had been seen, evaluated, and treated conservatively in the   office with medication, activity modification, and injections.  The patient had   radiographic changes of bone-on-bone arthritis with endplate sclerosis and osteophytes noted.      The patient failed conservative measures including medication, injections, and activity modification, and at this point was ready for more definitive measures.   Based on the radiographic changes and failed conservative measures, the patient   decided to proceed with total knee replacement.  Risks of  infection,   DVT, component failure, need for revision surgery, postop course, and   expectations were all   discussed and reviewed.  Consent was obtained for benefit of pain   relief.      PROCEDURE IN DETAIL:  The patient was brought to the operative theater.   Once adequate anesthesia, preoperative antibiotics, 3 gm of Ancef administered, the patient was positioned supine with the left thigh tourniquet placed.  The  left lower extremity was prepped and draped in sterile fashion.  A time-   out was performed identifying the patient, planned procedure, and   extremity.      The left lower extremity was placed in the [facility] leg holder.  The leg was   exsanguinated, tourniquet elevated to 250 mmHg.  A midline incision was   made followed by median parapatellar arthrotomy.  Following initial  exposure, attention was first directed to the patella.  Precut   measurement was noted to be 26 mm.  I resected down to 14-15 mm and used a   41 patellar button to restore patellar height as well as cover the cut   surface.      The lug holes were drilled and a metal shim was placed to protect the   patella from retractors and saw blades.      At this point, attention was now directed to the femur.  The femoral   canal was opened with a drill, irrigated to try to prevent fat emboli.  An   intramedullary rod was passed at 5 degrees valgus, 10 mm of bone was   resected off the distal femur.  Following this resection, the tibia was   subluxated anteriorly.  Using the extramedullary guide, 4 mm of bone was resected off   the proximal medial tibia.  We confirmed the gap would be   stable medially and laterally with a 10 mm insert as well as confirmed   the cut was perpendicular in the coronal plane, checking with an alignment rod.      Once this was done, I sized the femur to be a size 4 in the anterior-   posterior dimension, chose a standard component based on medial and   lateral dimension.  The  size 4 rotation block was then pinned in   position anterior referenced using the C-clamp to set rotation.  The   anterior, posterior, and  chamfer cuts were made without difficulty nor   notching making certain that I was along the anterior cortex to help   with flexion gap stability.      The final box cut was made off the lateral aspect of distal femur.      At this point, the tibia was sized to be a size 4, the size 4 tray was   then pinned in position through the medial third of the tubercle,   drilled, and keel punched.  Trial reduction was now carried with a 4 femur,  4 tibia, a 12.5-15 mm insert, and the 41 patella botton.  The knee was brought to   extension, full extension with good flexion stability with the patella   tracking through the trochlea without application of pressure.  Given   all these findings, the trial components removed.  Final components were   opened and cement was mixed.  The knee was irrigated with normal saline   solution and pulse lavage.  The synovial lining was   then injected with 30cc of 0.25% Marcaine with epinephrine and 1 cc of Toradol plus 30cc of NS,   total of 61 cc.  Due to the visualizing the proximal of two screw previously placed for a tubercle osteotomy I extended the incision distally excising his old scar.  The screw head was identified on the anterior-lateral aspect of the tubercle and then removed without an issue.     The knee was irrigated.  Final implants were then cemented onto clean and   dried cut surfaces of bone with the knee brought to extension with a 15 mm trial insert.      Once the cement had fully cured, the excess cement was removed   throughout the knee.  I confirmed I was satisfied with the range of   motion and stability, and the final 15 mm PS insert was chosen.  It was   placed into the  knee.      The tourniquet had been let down at 36 minutes.  No significant   hemostasis required.  The   extensor mechanism was  then reapproximated using #1 Vicryl with the knee   in flexion.  The   remaining wound was closed with 2-0 Vicryl and running 4-0 Monocryl.   The knee was cleaned, dried, dressed sterilely using Dermabond and   Aquacel dressing.  The patient was then   brought to recovery room in stable condition, tolerating the procedure   well.   Please note that Physician Assistant, Skip MayerBlair Roberts, PA-C, was present for the entirety of the case, and was utilized for pre-operative positioning, peri-operative retractor management, general facilitation of the procedure.  He was also utilized for primary wound closure at the end of the case.              Madlyn FrankelMatthew D. Charlann Boxerlin, M.D.    05/21/2014 11:34 AM

## 2014-05-21 NOTE — Evaluation (Signed)
Physical Therapy Evaluation Patient Details Name: Roberto Kim MRN: [id number] DOB: 07/11/1966 Today's Date: 05/21/2014   History of Present Illness  L TKA  Clinical Impression  Patient walked x 18o' and became  Diaphoretic , pale  Returning to room. BP 96/47. RN in, patient felt better with  Time, remained in recliner. Patient will benefit from PT to address problems  Listed in note below.    Follow Up Recommendations Home health PT    Equipment Recommendations  Rolling walker with 5" wheels    Recommendations for Other Services       Precautions / Restrictions Precautions Precautions: Fall Precaution Comments: hpotensive during eval. Restrictions Weight Bearing Restrictions: No      Mobility  Bed Mobility Overal bed mobility: Needs Assistance Bed Mobility: Supine to Sit     Supine to sit: Min assist;HOB elevated     General bed mobility comments: use of trapeze, assist for LLE support to floor, cues for technique.  Transfers Overall transfer level: Needs assistance Equipment used: Rolling walker (2 wheeled) Transfers: Sit to/from Stand Sit to Stand: +2 safety/equipment;Min assist;From elevated surface         General transfer comment: cues for technique and hand and R leg position  Ambulation/Gait Ambulation/Gait assistance: Min assist;+2 safety/equipment Ambulation Distance (Feet): 170 Feet Assistive device: Rolling walker (2 wheeled) Gait Pattern/deviations: Step-to pattern;Step-through pattern;Antalgic     General Gait Details: cues for sequrnce and posture, Patient became sweaty and pale at return  to room, assisted to sit down, RN in to assess. BP 96/47. sats 100. very pale  Stairs            Wheelchair Mobility    Modified Rankin (Stroke Patients Only)       Balance                                             Pertinent Vitals/Pain Pain Assessment: 0-10 Pain Score: 2  Pain Descriptors / Indicators:  Discomfort Pain Intervention(s): Monitored during session;Premedicated before session;Ice applied    Home Living Family/patient expects to be discharged to:: Private residence Living Arrangements: Spouse/significant other Available Help at Discharge: Family Type of Home: House Home Access: Level entry     Home Layout: Two level;Able to live on main level with bedroom/bathroom Home Equipment: None      Prior Function Level of Independence: Independent               Hand Dominance        Extremity/Trunk Assessment               Lower Extremity Assessment: LLE deficits/detail   LLE Deficits / Details: SLR with lag,  Cervical / Trunk Assessment: Normal  Communication   Communication: No difficulties  Cognition Arousal/Alertness: Awake/alert Behavior During Therapy: WFL for tasks assessed/performed Overall Cognitive Status: Within Functional Limits for tasks assessed                      General Comments      Exercises        Assessment/Plan    PT Assessment Patient needs continued PT services  PT Diagnosis Difficulty walking;Acute pain   PT Problem List Decreased strength;Decreased range of motion;Decreased activity tolerance;Decreased mobility;Decreased knowledge of precautions;Decreased safety awareness;Decreased knowledge of use of DME;Cardiopulmonary status limiting activity  PT Treatment Interventions DME instruction;Gait training;Stair training;Functional mobility  training;Therapeutic activities;Therapeutic exercise;Patient/family education   PT Goals (Current goals can be found in the Care Plan section) Acute Rehab PT Goals Patient Stated Goal: to walk without pain PT Goal Formulation: With patient Time For Goal Achievement: 05/23/14 Potential to Achieve Goals: Good    Frequency 7X/week   Barriers to discharge        Co-evaluation               End of Session   Activity Tolerance: Treatment limited secondary to medical  complications (Comment);Patient tolerated treatment well Patient left: in chair;with call bell/phone within reach Nurse Communication: Mobility status (diaphoresis, palor, hypotension)         Time: [id number] PT Time Calculation (min): 28 min   Charges:   PT Evaluation $Initial PT Evaluation Tier I: 1 Procedure PT Treatments $Gait Training: 23-37 mins   PT G Codes:          Sharen HeckHill, Kahealani Yankovich Elizabeth Charlen Bakula PT [phone]  05/21/2014, 6:13 PM

## 2014-05-21 NOTE — Anesthesia Procedure Notes (Signed)
Spinal Patient location during procedure: OR Start time: 05/21/2014 9:49 AM End time: 05/21/2014 9:52 AM Staffing Anesthesiologist: Felipe DroneJUDD, MARY JENNETTE Resident/CRNA: Lyda KalataJARVELA, Dilan Fullenwider R Performed by: resident/CRNA  Preanesthetic Checklist Completed: patient identified, site marked, surgical consent, pre-op evaluation, timeout performed, IV checked, risks and benefits discussed and monitors and equipment checked Spinal Block Patient position: sitting Prep: Betadine Patient monitoring: heart rate, cardiac monitor, continuous pulse ox and blood pressure Approach: midline Location: L3-4 Injection technique: single-shot Needle Needle type: Sprotte  Needle gauge: 24 G Needle length: 10 cm Needle insertion depth: 8 cm Assessment Sensory level: T4

## 2014-05-21 NOTE — Transfer of Care (Signed)
Immediate Anesthesia Transfer of Care Note  Patient: Roberto Kim  Procedure(s) Performed: Procedure(s): LEFT TOTAL KNEE ARTHROPLASTY, SCREW REMOVAL LEFT KNEE (Left)  Patient Location: PACU  Anesthesia Type:Regional  Level of Consciousness: awake, alert  and oriented  Airway & Oxygen Therapy: Patient Spontanous Breathing and Patient connected to face mask oxygen  Post-op Assessment: Report given to PACU RN and Post -op Vital signs reviewed and stable  Post vital signs: Reviewed and stable  Complications: No apparent anesthesia complications

## 2014-05-21 NOTE — Interval H&P Note (Signed)
History and Physical Interval Note:  05/21/2014 8:42 AM  Roberto Kim  has presented today for surgery, with the diagnosis of left knee osteoarthritis  The various methods of treatment have been discussed with the patient and family. After consideration of risks, benefits and other options for treatment, the patient has consented to  Procedure(s): LEFT TOTAL KNEE ARTHROPLASTY (Left) as a surgical intervention .  The patient's history has been reviewed, patient examined, no change in status, stable for surgery.  I have reviewed the patient's chart and labs.  Questions were answered to the patient's satisfaction.     [name],[name]

## 2014-05-21 NOTE — Anesthesia Postprocedure Evaluation (Signed)
  Anesthesia Post-op Note  Patient: Roberto Kim  Procedure(s) Performed: Procedure(s) (LRB): LEFT TOTAL KNEE ARTHROPLASTY, SCREW REMOVAL LEFT KNEE (Left)  Patient Location: PACU  Anesthesia Type: Spinal  Level of Consciousness: awake and alert   Airway and Oxygen Therapy: Patient Spontanous Breathing  Post-op Pain: mild  Post-op Assessment: Post-op Vital signs reviewed, Patient's Cardiovascular Status Stable, Respiratory Function Stable, Patent Airway and No signs of Nausea or vomiting  Last Vitals:  Filed Vitals:   05/21/14 1644  BP: 124/77  Pulse: 81  Temp: 36.5 C  Resp: 18    Post-op Vital Signs: stable   Complications: No apparent anesthesia complications

## 2014-05-22 ENCOUNTER — Encounter (HOSPITAL_COMMUNITY): Payer: Self-pay | Admitting: Orthopedic Surgery

## 2014-05-22 DIAGNOSIS — E669 Obesity, unspecified: Secondary | ICD-10-CM | POA: Diagnosis present

## 2014-05-22 LAB — CBC
HCT: 34.8 % — ABNORMAL LOW (ref 39.0–52.0)
Hemoglobin: 12.1 g/dL — ABNORMAL LOW (ref 13.0–17.0)
MCH: 31.3 pg (ref 26.0–34.0)
MCHC: 34.8 g/dL (ref 30.0–36.0)
MCV: 89.9 fL (ref 78.0–100.0)
PLATELETS: 202 10*3/uL (ref 150–400)
RBC: 3.87 MIL/uL — ABNORMAL LOW (ref 4.22–5.81)
RDW: 12.9 % (ref 11.5–15.5)
WBC: 14.9 10*3/uL — ABNORMAL HIGH (ref 4.0–10.5)

## 2014-05-22 LAB — BASIC METABOLIC PANEL
ANION GAP: 13 (ref 5–15)
BUN: 16 mg/dL (ref 6–23)
CO2: 24 mEq/L (ref 19–32)
Calcium: 9.2 mg/dL (ref 8.4–10.5)
Chloride: 100 mEq/L (ref 96–112)
Creatinine, Ser: 0.82 mg/dL (ref 0.50–1.35)
Glucose, Bld: 152 mg/dL — ABNORMAL HIGH (ref 70–99)
POTASSIUM: 4.3 meq/L (ref 3.7–5.3)
SODIUM: 137 meq/L (ref 137–147)

## 2014-05-22 MED ORDER — HYDROCODONE-ACETAMINOPHEN 7.5-325 MG PO TABS: 1.0000 | ORAL_TABLET | ORAL | Status: DC | PRN

## 2014-05-22 MED ORDER — FERROUS SULFATE 325 (65 FE) MG PO TABS: 325.0000 mg | ORAL_TABLET | Freq: Three times a day (TID) | ORAL | Status: AC

## 2014-05-22 MED ORDER — POLYETHYLENE GLYCOL 3350 17 G PO PACK: 17.0000 g | PACK | Freq: Two times a day (BID) | ORAL | Status: AC

## 2014-05-22 MED ORDER — METHOCARBAMOL 500 MG PO TABS: 500.0000 mg | ORAL_TABLET | Freq: Four times a day (QID) | ORAL | Status: AC | PRN

## 2014-05-22 MED ORDER — ASPIRIN 325 MG PO TBEC
325.0000 mg | DELAYED_RELEASE_TABLET | Freq: Two times a day (BID) | ORAL | Status: AC
Start: 2014-05-22 — End: ?

## 2014-05-22 MED ORDER — DSS 100 MG PO CAPS: 100.0000 mg | ORAL_CAPSULE | Freq: Two times a day (BID) | ORAL | Status: AC

## 2014-05-22 NOTE — Care Management Note (Addendum)
    Page 1 of 2   05/22/2014     2:52:37 PM CARE MANAGEMENT NOTE 05/22/2014  Patient:  Becker,Kanen   Account Number:  [id number]  Date Initiated:  05/22/2014  Documentation initiated by:  [facility]  Subjective/Objective Assessment:   adm: LEFT TOTAL KNEE ARTHROPLASTY, SCREW REMOVAL LEFT KNEE (Left)     Action/Plan:   discharge planning   Anticipated DC Date:  05/22/2014   Anticipated DC Plan:  [place]  CM consult      [facility] Choice  HOME HEALTH   Choice offered to / List presented to:  C-1 Patient   DME arranged  3-N-1  Vassie Moselle      DME agency  [place] arranged  [place]   Status of service:  Completed, signed off Medicare Important Message given?   (If response is "NO", the following Medicare IM given date fields will be blank) Date Medicare IM given:   Medicare IM given by:   Date Additional Medicare IM given:   Additional Medicare IM given by:    Discharge Disposition:  [place]  Per UR Regulation:  Reviewed for med. necessity/level of care/duration of stay  If discussed at [place] of Stay Meetings, dates discussed:    Comments:  05/22/14 11:00 CM met with pt in room to offer choice of home health agency.  Pt chooses [facility] to render HHPT. Address anc contact information verified with pt.  CM called [facility] DME rep, Lecretia to please deliver bari 3n1, and bari rolling walker to room prior to discharge today. Referral called to [facility] for HHPT.  No other Cm needs were communicated.  Mariane Masters, BSN, CM [phone].

## 2014-05-22 NOTE — Plan of Care (Signed)
Problem: Phase I Progression Outcomes Goal: CMS/Neurovascular status WDL Outcome: Completed/Met Date Met:  05/22/14 Goal: Pain controlled with appropriate interventions Outcome: Completed/Met Date Met:  05/22/14 Goal: Dangle or out of bed evening of surgery Outcome: Completed/Met Date Met:  05/22/14 Goal: Initial discharge plan identified Outcome: Completed/Met Date Met:  05/22/14 Goal: Hemodynamically stable Outcome: Completed/Met Date Met:  05/22/14 Goal: Other Phase I Outcomes/Goals Outcome: Completed/Met Date Met:  05/22/14  Problem: Phase II Progression Outcomes Goal: Ambulates Outcome: Completed/Met Date Met:  05/22/14 Goal: Tolerating diet Outcome: Completed/Met Date Met:  05/22/14 Goal: Discharge plan established Outcome: Completed/Met Date Met:  05/22/14 Goal: Other Phase II Outcomes/Goals Outcome: Not Applicable Date Met:  94/76/54  Problem: Phase III Progression Outcomes Goal: Pain controlled on oral analgesia Outcome: Completed/Met Date Met:  05/22/14 Goal: Ambulates Outcome: Completed/Met Date Met:  05/22/14 Goal: Discharge plan remains appropriate-arrangements made Outcome: Completed/Met Date Met:  05/22/14 Goal: Anticoagulant follow-up in place Outcome: Not Applicable Date Met:  65/03/54 [facility] BID for VTE, no f/u needed. Goal: Other Phase III Outcomes/Goals Outcome: Not Applicable Date Met:  65/68/12  Problem: Discharge Progression Outcomes Goal: Barriers To Progression Addressed/Resolved Outcome: Not Applicable Date Met:  75/17/00 Goal: CMS/Neurovascular status at or above baseline Outcome: Completed/Met Date Met:  05/22/14 Goal: Anticoagulant follow-up in place Outcome: Not Applicable Date Met:  17/49/44 Goal: Pain controlled with appropriate interventions Outcome: Completed/Met Date Met:  05/22/14 Goal: Hemodynamically stable Outcome: Completed/Met Date Met:  05/22/14

## 2014-05-22 NOTE — Progress Notes (Signed)
OT Cancellation Note  Patient Details Name: Roberto Kim MRN: [id number] DOB: 10/01/1965   Cancelled Treatment:    Reason Eval/Treat Not Completed: Other (comment) Session initiated and pt starting to feel nauseous/queasy before getting OOB. Nursing informed and pt given some nausea meds. Will check back.  Lennox LaityStone, Aryan Sparks [place]  [phone] 05/22/2014, 10:19 AM

## 2014-05-22 NOTE — Progress Notes (Addendum)
Physical Therapy Treatment Patient Details Name: Roberto Kim MRN: [id number] DOB: 01/05/1966 Today's Date: 05/22/2014    History of Present Illness L TKA    PT Comments    Feels better. Plans Dc later after PT,  Follow Up Recommendations  Home health PT     Equipment Recommendations  Rolling walker with 5" wheels    Recommendations for Other Services       Precautions / Restrictions Precautions Precautions: Fall;Knee Precaution Comments: hpotensive during eval.    Mobility  Bed Mobility   Bed Mobility: Supine to Sit     Supine to sit: Min guard     General bed mobility comments: use of trapeze, assist for LLE support to floor, cues for technique.  Transfers Overall transfer level: Needs assistance Equipment used: Rolling walker (2 wheeled) Transfers: Sit to/from Stand Sit to Stand: Min guard         General transfer comment: verbal cues for hand placement and LE management.  Ambulation/Gait Ambulation/Gait assistance: Min guard Ambulation Distance (Feet): 180 Feet Assistive device: Rolling walker (2 wheeled) Gait Pattern/deviations: Step-through pattern;Decreased step length - left     General Gait Details: cues for sequrnce and posture, Patient became sweaty and pale at return  to room, assisted to sit down, RN in to assess. BP 96/47. sats 100. very pale   Stairs            Wheelchair Mobility    Modified Rankin (Stroke Patients Only)       Balance                                    Cognition Arousal/Alertness: Awake/alert Behavior During Therapy: WFL for tasks assessed/performed Overall Cognitive Status: Within Functional Limits for tasks assessed                      Exercises Total Joint Exercises Quad Sets: AROM;Both;10 reps Short Arc QuadBarbaraann Boys: AAROM;Left;10 reps Heel Slides: AAROM;Left;10 reps Hip ABduction/ADduction: AAROM;Left;10 reps Straight Leg Raises: AAROM;Left;10 reps Long Arc Quad:  AAROM;Left;10 reps Knee Flexion: AAROM;Left;10 reps Goniometric ROM: 10-75    General Comments        Pertinent Vitals/Pain Pain Score: 2     Home Living Family/patient expects to be discharged to:: Private residence Living Arrangements: Spouse/significant other Available Help at Discharge: Family Type of Home: House Home Access: Level entry   Home Layout: Two level;Able to live on main level with bedroom/bathroom Home Equipment: None      Prior Function Level of Independence: Independent          PT Goals (current goals can now be found in the care plan section) Acute Rehab PT Goals Patient Stated Goal: increase independence.  Progress towards PT goals: Progressing toward goals    Frequency  7X/week    PT Plan Current plan remains appropriate    Co-evaluation             End of Session   Activity Tolerance: Patient tolerated treatment well Patient left:  (with OT)     Time: 4098-1191: 1056-1123 PT Time Calculation (min):   Charges:                     G Codes:      [name], Bayan Hedstrom Elizabeth 05/22/2014, 3:48 PM

## 2014-05-22 NOTE — Evaluation (Addendum)
Occupational Therapy Evaluation Patient Details Name: Roberto CapriceChristopher Kim MRN: [id number] DOB: 08/18/1965 Today's Date: 05/22/2014    History of Present Illness L TKA   Clinical Impression   Pt doing well and hopeful for d/c later today. Wife able to assist at home PRN.     Follow Up Recommendations  No OT follow up;Supervision/Assistance - 24 hour    Equipment Recommendations  3 in 1 bedside comode    Recommendations for Other Services       Precautions / Restrictions Precautions Precautions: Fall Precaution Comments: hypotensive during eval.      Mobility Bed Mobility                  Transfers Overall transfer level: Needs assistance Equipment used: Rolling walker (2 wheeled) Transfers: Sit to/from Stand Sit to Stand: Min guard         General transfer comment: verbal cues for hand placement and LE management.    Balance                                            ADL Overall ADL's : Needs assistance/impaired Eating/Feeding: Independent;Sitting   Grooming: Wash/dry hands;Set up;Sitting   Upper Body Bathing: Set up;Sitting   Lower Body Bathing: Min guard;Sit to/from stand   Upper Body Dressing : Set up;Sitting   Lower Body Dressing: Min guard;Sit to/from stand Lower Body Dressing Details (indicate cue type and reason): able to don L sock by bending forward to his foot in sitting. Toilet Transfer: Min guard;Ambulation;BSC;RW Toilet Transfer Details (indicate cue type and reason): min verbal cues for safety with walker and hand placement. Toileting- ArchitectClothing Manipulation and Hygiene: Min guard;Sit to/from stand         General ADL Comments: Pt states he will sponge bathe initially as he is having difficulty raising up L LE --discussed safety of being able to raise L LE up on his own to be able to safely step in bathtub. [facility] also. Pt able bend knee enough to don L sock sitting in  chair. Encouraged him to work on donning own clothing rather than having help as this will work on flexibility of knee more. Wife can help  PRN however. Discussed sequence for LB dressing and having walker in front of him when he stands. Pt doing well and wanting to d/c later today. Min cues for safety with walker in bathroom to side step through tight space and not pick up walker. Also min cues to back up to surface fully and hand placement. Discussed how to adjust 3in1 for appropriate height.     Vision                     Perception     Praxis      Pertinent Vitals/Pain Pain Assessment: 0-10 Pain Score: 2  Pain Location: L knee Pain Descriptors / Indicators: Tightness Pain Intervention(s): Repositioned;Ice applied     Hand Dominance     Extremity/Trunk Assessment Upper Extremity Assessment Upper Extremity Assessment: Overall WFL for tasks assessed           Communication Communication Communication: No difficulties   Cognition Arousal/Alertness: Awake/alert Behavior During Therapy: WFL for tasks assessed/performed Overall Cognitive Status: Within Functional Limits for tasks assessed  General Comments       Exercises       Shoulder Instructions      Home Living Family/patient expects to be discharged to:: Private residence Living Arrangements: Spouse/significant other Available Help at Discharge: Family Type of Home: House Home Access: Level entry     Home Layout: Two level;Able to live on main level with bedroom/bathroom Alternate Level Stairs-Number of Steps: 17   Bathroom Shower/Tub: Chief Strategy OfficerTub/shower unit   Bathroom Toilet: Standard     Home Equipment: None          Prior Functioning/Environment Level of Independence: Independent             OT Diagnosis: Generalized weakness   OT Problem List: Decreased strength;Decreased knowledge of use of DME or AE   OT Treatment/Interventions: Self-care/ADL  training;Patient/family education;Therapeutic activities;DME and/or AE instruction    OT Goals(Current goals can be found in the care plan section) Acute Rehab OT Goals Patient Stated Goal: increase independence.  OT Goal Formulation: With patient Time For Goal Achievement: 05/29/14 Potential to Achieve Goals: Good  OT Frequency: Min 2X/week   Barriers to D/C:            Co-evaluation              End of Session Equipment Utilized During Treatment: Rolling walker  Activity Tolerance: Patient tolerated treatment well Patient left: in chair;with call bell/phone within reach   Time: 8469-62951123-1148 OT Time Calculation (min): 25 min Charges:  OT General Charges $OT Visit: 1 Procedure OT Evaluation $Initial OT Evaluation Tier I: 1 Procedure OT Treatments $Therapeutic Activity: 8-22 mins G-Codes:    Lennox LaityStone, Philamena Kramar Stafford  [phone] 05/22/2014, 12:11 PM

## 2014-05-22 NOTE — Progress Notes (Signed)
Pt to d/c home with [facility] home [facility]. DME delivered to room before d/c. AVS reviewed and "My Chart" discussed with pt. Pt capable of verbalizing medications, signs and symptoms of infection, and follow-up appointments. Remains hemodynamically stable. No signs and symptoms of distress. Educated pt to return to ER in the case of SOB, dizziness, or chest pain.  

## 2014-05-22 NOTE — Progress Notes (Signed)
     Subjective: 1 Day Post-Op Procedure(s) (LRB): LEFT TOTAL KNEE ARTHROPLASTY, SCREW REMOVAL LEFT KNEE (Left)   Patient reports pain as mild, pain controlled. No events throughout the night. Ready to be discharged home.  Objective:   VITALS:   Filed Vitals:   05/22/14  BP: 137/65  Pulse: 109  Temp: 98.5 F (36.9 C)   Resp: 18    Dorsiflexion/Plantar flexion intact Incision: dressing C/D/I No cellulitis present Compartment soft  LABS  Recent Labs  05/22/14 0435  HGB 12.1*  HCT 34.8*  WBC 14.9*  PLT 202     Recent Labs  05/22/14 0435  NA 137  K 4.3  BUN 16  CREATININE 0.82  GLUCOSE 152*     Assessment/Plan: 1 Day Post-Op Procedure(s) (LRB): LEFT TOTAL KNEE ARTHROPLASTY, SCREW REMOVAL LEFT KNEE (Left) Foley cath d/c'ed Advance diet Up with therapy D/C IV fluids Discharge home with home health  [facility]. Follow up with OLIN,Iren Whipp D in 2 weeks.  Contact information:  [facility] [address], [address] [place] [id number] [phone]    Obese (BMI 30-39.9) Estimated body mass index is 37.74 kg/(m^2) as calculated from the following:   Height as of this encounter: 6\' 1"  (1.854 m).   Weight as of this encounter: 129.729 kg (286 lb). Patient also counseled that weight may inhibit the healing process Patient counseled that losing weight will help with future health issues        Anastasio AuerbachMatthew S. Jastin Fore   PAC  05/22/2014, 9:15 AM

## 2014-05-22 NOTE — Progress Notes (Signed)
Physical Therapy Treatment Patient Details Name: Roberto Kim MRN: [id number] DOB: 10/20/1965 Today's Date: 05/22/2014    History of Present Illness L TKA    PT Comments    Ready for DC, wife present for instructions in exercises.  Follow Up Recommendations  Home health PT     Equipment Recommendations  Rolling walker with 5" wheels    Recommendations for Other Services       Precautions / Restrictions Precautions Precautions: Fall;Knee Precaution Comments: hpotensive during eval.    Mobility  Bed Mobility   Bed Mobility: Supine to Sit     Supine to sit: Min guard     General bed mobility comments: use of trapeze, assist for LLE support to floor, cues for technique.  Transfers Overall transfer level: Needs assistance Equipment used: Rolling walker (2 wheeled) Transfers: Sit to/from Stand Sit to Stand: Min guard         General transfer comment: verbal cues for hand placement and LE management.  Ambulation/Gait Ambulation/Gait assistance: Supervision Ambulation Distance (Feet): 300 Feet Assistive device: Rolling walker (2 wheeled) Gait Pattern/deviations: Step-through pattern;Decreased step length - left     General Gait Details: cues for sequrnce and posture, Patient became sweaty and pale at return  to room, assisted to sit down, RN in to assess. BP 96/47. sats 100. very pale   Stairs            Wheelchair Mobility    Modified Rankin (Stroke Patients Only)       Balance                                    Cognition Arousal/Alertness: Awake/alert Behavior During Therapy: WFL for tasks assessed/performed Overall Cognitive Status: Within Functional Limits for tasks assessed                      Exercises Total Joint Exercises Quad Sets: AROM;Both;10 reps Short Arc QuadBarbaraann Boys: AAROM;Left;10 reps Heel Slides: AAROM;Left;10 reps Hip ABduction/ADduction: AAROM;Left;10 reps Straight Leg Raises: AAROM;Left;10  reps Long Arc Quad: AAROM;Left;10 reps Knee Flexion: AAROM;Left;10 reps Goniometric ROM: 10-75    General Comments        Pertinent Vitals/Pain Pain Score: 2     Home Living Family/patient expects to be discharged to:: Private residence Living Arrangements: Spouse/significant other Available Help at Discharge: Family Type of Home: House Home Access: Level entry   Home Layout: Two level;Able to live on main level with bedroom/bathroom Home Equipment: None      Prior Function Level of Independence: Independent          PT Goals (current goals can now be found in the care plan section) Acute Rehab PT Goals Patient Stated Goal: increase independence.  Progress towards PT goals: Progressing toward goals    Frequency  7X/week    PT Plan Current plan remains appropriate    Co-evaluation             End of Session   Activity Tolerance: Patient tolerated treatment well Patient left: in bed;with call bell/phone within reach;with family/visitor present     Time: [id number] PT Time Calculation (min): 27 min  Charges:  $Gait Training: 8-22 mins $Therapeutic Exercise: 8-22 mins                    G Codes:      [name], [name] 05/22/2014, 3:56 PM

## 2014-05-29 NOTE — Discharge Summary (Signed)
Physician Discharge Summary  Patient ID: Roberto Kim MRN: [id number] DOB/AGE: 01-30-1966 48 y.o.  Admit date: 05/21/2014 Discharge date: 05/22/2014   Procedures:  Procedure(s) (LRB): LEFT TOTAL KNEE ARTHROPLASTY, SCREW REMOVAL LEFT KNEE (Left)  Attending Physician:  Dr. Durene Romans   Admission Diagnoses:   Left knee primary OA / pain  Discharge Diagnoses:  Principal Problem:   S/P left TKA Active Problems:   Obese  Past Medical History  Diagnosis Date  . Essential hypertension   . Hyperlipidemia   . Obesity (BMI 35.0-39.9 without comorbidity)     Class II  . Hyperglycemia   . OSA (obstructive sleep apnea)   . Hypertension   . Sleep apnea     no cpap   . Arthritis     HPI: Roberto Kim, 48 y.o. male, has a history of pain and functional disability in the left knee due to arthritis and has failed non-surgical conservative treatments for greater than 12 weeks to includeNSAID's and/or analgesics, corticosteriod injections, viscosupplementation injections, use of assistive devices and activity modification. Onset of symptoms was gradual, starting 20+ years ago with gradually worsening course since that time. The patient noted prior procedures on the knee to include osteotomy on the left knee(s). Patient currently rates pain in the left knee(s) at 8 out of 10 with activity. Patient has night pain, worsening of pain with activity and weight bearing, pain that interferes with activities of daily living, pain with passive range of motion, crepitus and joint swelling. Patient has evidence of periarticular osteophytes and joint space narrowing by imaging studies. There is no active infection. Risks, benefits and expectations were discussed with the patient. Risks including but not limited to the risk of anesthesia, blood clots, nerve damage, blood vessel damage, failure of the prosthesis, infection and up to and including death. Patient understand the risks, benefits and  expectations and wishes to proceed with surgery.   PCP: Roberto Bangs, MD   Discharged Condition: good  Hospital Course:  Patient underwent the above stated procedure on 05/21/2014. Patient tolerated the procedure well and brought to the recovery room in good condition and subsequently to the floor.  POD #1 BP: 137/65 ; Pulse: 109 ; Temp: 98.5 F (36.9 C) ; Resp: 18  Patient reports pain as mild, pain controlled. No events throughout the night. Ready to be discharged home. Dorsiflexion/plantar flexion intact, incision: dressing C/D/I, no cellulitis present and compartment soft.   LABS  Basename    HGB  12.1  HCT  34.8    Discharge Exam: General appearance: alert, cooperative and no distress Extremities: Homans sign is negative, no sign of DVT, no edema, redness or tenderness in the calves or thighs and no ulcers, gangrene or trophic changes  Disposition: Home with follow up in 2 weeks   Follow-up Information    Follow up with Shelda Pal, MD. Schedule an appointment as soon as possible for a visit in 2 weeks.   Specialty:  Orthopedic Surgery   Contact information:   [address] [place] [address] [phone]       Follow up with [facility].   Why:  home health physical therapy   Contact information:   [address] [place] [address] [phone]       Follow up with [facility].   Why:  3n1 (commode), and rolling walker   Contact information:   [address] [place] [address] [phone]  Discharge Instructions    Call MD / Call 911    Complete by:  As directed   If you experience chest pain or shortness of breath, CALL 911 and be transported to the hospital emergency room.  If you develope a fever above 101 F, pus (white drainage) or increased drainage or redness at the wound, or calf pain, call your surgeon's office.     Change dressing    Complete by:  As directed    Maintain surgical dressing for 10-14 days, or until follow up in the clinic.     Constipation Prevention    Complete by:  As directed   Drink plenty of fluids.  Prune juice may be helpful.  You may use a stool softener, such as Colace (over the counter) 100 mg twice a day.  Use MiraLax (over the counter) for constipation as needed.     Diet - low sodium heart healthy    Complete by:  As directed      Discharge instructions    Complete by:  As directed   Maintain surgical dressing for 10-14 days, or until follow up in the clinic. [facility]. Call with any questions or concerns.     Increase activity slowly as tolerated    Complete by:  As directed      TED hose    Complete by:  As directed   Use stockings (TED hose) for 2 weeks on both leg(s).  You may remove them at night for sleeping.     Weight bearing as tolerated    Complete by:  As directed   Laterality:  left  Extremity:  Lower             Medication List    STOP taking these medications        Naproxen Sodium 220 MG Caps     naproxen sodium 220 MG tablet  Commonly known as:  ANAPROX      TAKE these medications        aspirin 325 MG EC tablet  Take 1 tablet (325 mg total) by mouth 2 (two) times daily.     DSS 100 MG Caps  Take 100 mg by mouth 2 (two) times daily.     ferrous sulfate 325 (65 FE) MG tablet  Take 1 tablet (325 mg total) by mouth 3 (three) times daily after meals.     fluticasone 50 MCG/ACT nasal spray  Commonly known as:  FLONASE  Place 1 spray into both nostrils at bedtime.     HYDROcodone-acetaminophen 7.5-325 MG per tablet  Commonly known as:  NORCO  Take 1-2 tablets by mouth every 4 (four) hours as needed for moderate pain.     losartan 50 MG tablet  Commonly known as:  COZAAR  Take 50 mg by mouth every morning.     losartan 50 MG tablet  Commonly known as:  COZAAR  Take 50 mg by mouth daily.     methocarbamol 500 MG tablet  Commonly known as:   ROBAXIN  Take 1 tablet (500 mg total) by mouth every 6 (six) hours as needed for muscle spasms.     OVER THE COUNTER MEDICATION  Place 1 drop into both eyes daily as needed (allergies/cat hair in eyes.). Allergy eye drops.- Napcon     polyethylene glycol packet  Commonly known as:  MIRALAX / GLYCOLAX  Take 17 g by mouth 2 (two) times daily.  predniSONE 5 MG Tabs tablet  Commonly known as:  STERAPRED UNI-PAK  Take 5 mg by mouth daily.     PRESCRIPTION MEDICATION  Inject 1 each into the skin every 14 (fourteen) days. Allergy shot.     rosuvastatin 10 MG tablet  Commonly known as:  CRESTOR  Take 10 mg by mouth every morning.     CRESTOR 10 MG tablet  Generic drug:  rosuvastatin  Take 10 mg by mouth daily.     sildenafil 20 MG tablet  Commonly known as:  REVATIO  Take 20 mg by mouth as needed.     sildenafil 20 MG tablet  Commonly known as:  REVATIO  Take 40 mg by mouth. As needed         Signed: Anastasio AuerbachMatthew S. Channie Bostick   PA-C  05/29/2014, 1:07 PM

## 2017-04-17 ENCOUNTER — Emergency Department
Admission: EM | Admit: 2017-04-17 | Discharge: 2017-04-17 | Disposition: A | Discharge status: Home / Self Care | Payer: BLUE CROSS/BLUE SHIELD | Source: Home / Self Care | Attending: Emergency Medicine | Admitting: Emergency Medicine

## 2017-04-17 ENCOUNTER — Emergency Department (INDEPENDENT_AMBULATORY_CARE_PROVIDER_SITE_OTHER): Payer: BLUE CROSS/BLUE SHIELD

## 2017-04-17 ENCOUNTER — Encounter: Payer: Self-pay | Admitting: Emergency Medicine

## 2017-04-17 DIAGNOSIS — R0602 Shortness of breath: Secondary | ICD-10-CM | POA: Diagnosis not present

## 2017-04-17 DIAGNOSIS — J209 Acute bronchitis, unspecified: Secondary | ICD-10-CM

## 2017-04-17 DIAGNOSIS — R05 Cough: Secondary | ICD-10-CM | POA: Diagnosis not present

## 2017-04-17 MED ORDER — METHYLPREDNISOLONE ACETATE 80 MG/ML IJ SUSP
80.0000 mg | Freq: Once | INTRAMUSCULAR | Status: AC
  Administered 2017-04-17: 80 mg via INTRAMUSCULAR

## 2017-04-17 MED ORDER — PREDNISONE 20 MG PO TABS: 20.0000 mg | ORAL_TABLET | Freq: Two times a day (BID) | ORAL | 0 refills | Status: DC

## 2017-04-17 MED ORDER — CEFTRIAXONE SODIUM 1 G IJ SOLR
1.0000 g | INTRAMUSCULAR | Status: AC
  Administered 2017-04-17: 1 g via INTRAMUSCULAR

## 2017-04-17 MED ORDER — AZITHROMYCIN 250 MG PO TABS: ORAL_TABLET | ORAL | 0 refills | Status: DC

## 2017-04-17 MED ORDER — PROMETHAZINE-CODEINE 6.25-10 MG/5ML PO SYRP: ORAL_SOLUTION | ORAL | 0 refills | Status: AC

## 2017-04-17 NOTE — ED Triage Notes (Signed)
Cough, Congestion and Ear Pain x 4 days

## 2017-04-17 NOTE — ED Provider Notes (Signed)
[facility]    CSN: [id number] Arrival date & time: 04/17/17  1743     History   Chief Complaint Chief Complaint  Patient presents with  . Cough    HPI Roberto Kim is a 51 y.o. male.   HPI 5 days of progressive URI symptoms. Started with sinus congestion, bilateral ear pain and congestion, progressed to mild sore throat, then progressed to chest congestion with cough productive of yellow sputum. Low-grade fever. No chills. The cough is severe and hacking at times, and the cough keeps him up at night. No definite shortness of breath. He has chest pain only when he coughs, but denies exertional chest pain. No nausea or vomiting or abdominal pain. He tried OTC cough med which helped a little.  He denies chronic lung disease, but states he had pneumonia about 20 years ago.  Past Medical History:  Diagnosis Date  . Arthritis   . Essential hypertension   . Hyperglycemia   . Hyperlipidemia   . Hypertension   . 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 OSA (obstructive sleep apnea)   . Sleep apnea    no cpap     Patient Active Problem List   Diagnosis Date Noted  . Obese 05/22/2014  . 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 Preop cardiovascular exam 05/08/2014  . HTN (hypertension) 05/08/2014  . Elevated lipids 05/08/2014  . Obesity hypoventilation syndrome (HCC) 05/08/2014  . Arthritis 05/08/2014    Past Surgical History:  Procedure Laterality Date  . APPENDECTOMY    . HIGH TIBIAL OSTEOTOMY    . pins in right hand     . right hand     pins  . TIBIA OSTEOTOMY     left knee   . TOTAL KNEE ARTHROPLASTY Left 05/21/2014   Procedure: LEFT TOTAL KNEE ARTHROPLASTY, SCREW REMOVAL LEFT KNEE;  Surgeon: Shelda Pal, MD;  Location: [facility];  Service: Orthopedics;  Laterality: Left;  Marland Kitchen VASECTOMY         Home Medications    Prior to Admission medications   Medication Sig Start Date End Date Taking? Authorizing Provider  aspirin EC 325 MG EC  tablet Take 1 tablet (325 mg total) by mouth 2 (two) times daily. 05/22/14   Lanney Gins, PA-C  azithromycin (ZITHROMAX Z-PAK) 250 MG tablet Take 2 tablets on day one, then 1 tablet daily on days 2 through 5 04/17/17   Lajean Manes, MD  CRESTOR 10 MG tablet Take 10 mg by mouth daily. 04/03/14   [provider]  docusate sodium 100 MG CAPS Take 100 mg by mouth 2 (two) times daily. 05/22/14   Lanney Gins, PA-C  ferrous sulfate 325 (65 FE) MG tablet Take 1 tablet (325 mg total) by mouth 3 (three) times daily after meals. 05/22/14   Lanney Gins, PA-C  fluticasone (FLONASE) 50 MCG/ACT nasal spray Place 1 spray into both nostrils at bedtime.    [provider]  losartan (COZAAR) 50 MG tablet Take 50 mg by mouth every morning.    [provider]  losartan (COZAAR) 50 MG tablet Take 50 mg by mouth daily. 02/27/14   [provider]  methocarbamol (ROBAXIN) 500 MG tablet Take 1 tablet (500 mg total) by mouth every 6 (six) hours as needed for muscle spasms. 05/22/14   Lanney Gins, PA-C  OVER THE COUNTER MEDICATION Place 1 drop into both eyes daily as needed (allergies/cat hair in eyes.). Allergy eye drops.- Napcon  [provider]  polyethylene glycol (MIRALAX / GLYCOLAX) packet Take 17 g by mouth 2 (two) times daily. 05/22/14   Lanney Gins, PA-C  predniSONE (DELTASONE) 20 MG tablet Take 1 tablet (20 mg total) by mouth 2 (two) times daily with a meal. For 5 days 04/17/17   Lajean Manes, MD  PRESCRIPTION MEDICATION Inject 1 each into the skin every 14 (fourteen) days. Allergy shot.    [provider]  promethazine-codeine (PHENERGAN WITH CODEINE) 6.25-10 MG/5ML syrup Take 1-2 teaspoons every 6 hours as needed for cough. May cause drowsiness. 04/17/17   Lajean Manes, MD  rosuvastatin (CRESTOR) 10 MG tablet Take 10 mg by mouth every morning.    [provider]  sildenafil (REVATIO) 20 MG tablet Take 20 mg by mouth as needed.    [provider]  sildenafil (REVATIO) 20 MG tablet Take 40 mg by mouth. As needed 09/06/13   [provider]    Family History Family History  Problem Relation Age of Onset  . Hypertension Mother   . Hypertension Father     Social History Social History  Substance Use Topics  . Smoking status: Never Smoker  . Smokeless tobacco: Never Used  . Alcohol use Yes     Comment: occasional      Allergies   Ace inhibitors and Statins   Review of Systems Review of Systems  All other systems reviewed and are negative.    Physical Exam Triage Vital Signs ED Triage Vitals  Enc Vitals Group     BP 04/17/17 1802 130/85     Pulse Rate 04/17/17 1802 (!) 111     Resp 04/17/17 1802 16     Temp 04/17/17 1802 99.3 F (37.4 C)     Temp Source 04/17/17 1802 Oral     SpO2 04/17/17 1802 97 %     Weight 04/17/17 1803 295 lb (133.8 kg)     Height 04/17/17 1803  (1.854 m)     Head Circumference --      Peak Flow --      Pain Score 04/17/17 1803 5     Pain Loc --      Pain Edu? --      Excl. in GC? --    No data found.   Updated Vital Signs BP 130/85 (BP Location: Left Arm)   Pulse (!) 111   Temp 99.3 F (37.4 C) (Oral)   Resp 16   Ht  (1.854 m)   Wt 295 lb (133.8 kg)   SpO2 97%   BMI 38.92 kg/m   Pulse repeated 96, regular Physical Exam  Constitutional: He is oriented to person, place, and time. He appears well-developed and well-nourished. No distress.  HENT:  Head: Normocephalic and atraumatic.  Right Ear: Tympanic membrane, external ear and ear canal normal.  Left Ear: Tympanic membrane, external ear and ear canal normal.  Nose: Mucosal edema and rhinorrhea present. Right sinus exhibits maxillary sinus tenderness. Left sinus exhibits maxillary sinus tenderness.  Mouth/Throat: Oropharynx is clear and moist. No oral lesions. No oropharyngeal exudate.  Eyes: Right eye exhibits no discharge. Left eye exhibits no discharge. No scleral icterus.  Neck: Neck  supple.  Cardiovascular: Normal rate, regular rhythm and normal heart sounds.   Pulmonary/Chest: Effort normal. No respiratory distress. He has no wheezes. He has rhonchi. He has no rales.  Lungs: Harsh rhonchi diffusely. Rare late expiratory wheeze, only on forced expiration. Breath sounds equal. No definite rales  Lymphadenopathy:  He has no cervical adenopathy.  Neurological: He is alert and oriented to person, place, and time.  Skin: Skin is warm and dry. No rash noted.  Nursing note and vitals reviewed.  Workup options discussed, and we both agree to order chest x-ray, as he has some signs and symptoms of pneumonia.  UC Treatments / Results  Labs (all labs ordered are listed, but only abnormal results are displayed) Labs Reviewed - No data to display  EKG  EKG Interpretation None       Radiology CHEST XRAY  2 VIEW  COMPARISON:  None.  FINDINGS: Normal sized heart. Clear lungs. Mild diffuse peribronchial thickening. Unremarkable bones.  IMPRESSION: Mild bronchitic changes.  Electronically Signed   By: Beckie Salts M.D.   On: 04/17/2017 18:38  Procedures Procedures (including critical care time)  Medications Ordered in UC Medications  methylPREDNISolone acetate (DEPO-MEDROL) injection 80 mg (80 mg Intramuscular Given 04/17/17 1904)  cefTRIAXone (ROCEPHIN) injection 1 g (1 g Intramuscular Given 04/17/17 1904)      Initial Impression / Assessment and Plan / UC Course  Chest x-ray negative for infiltrate, but shows bronchitic changes. He likely has acute bronchitis with mild bronchospasm.  I have reviewed the triage vital signs and the nursing notes.  Pertinent labs & imaging results that were available during my care of the patient were reviewed by me and considered in my medical decision making (see chart for details).     Final Clinical Impressions(s) / UC Diagnoses   Final diagnoses:  Acute bronchitis, unspecified organism  Treatment options  discussed, as well as risks, benefits, alternatives. Patient voiced understanding and agreement with the following plans: Rocephin 1 g IM stat Depo-Medrol 80 mg IM stat  New Prescriptions Discharge Medication List as of 04/17/2017  6:50 PM    START taking these medications   Details  azithromycin (ZITHROMAX Z-PAK) 250 MG tablet Take 2 tablets on day one, then 1 tablet daily on days 2 through 5, Print    predniSONE (DELTASONE) 20 MG tablet Take 1 tablet (20 mg total) by mouth 2 (two) times daily with a meal. For 5 days, Starting Sun 04/17/2017, Print    promethazine-codeine (PHENERGAN WITH CODEINE) 6.25-10 MG/5ML syrup Take 1-2 teaspoons every 6 hours as needed for cough. May cause drowsiness., Print       An After Visit Summary was printed and given to the patient.:  Chest x-ray shows severe bronchitis but no definite pneumonia. Today, we gave you an antibiotic shot of Rocephin and a cortisone shot of Depo-Medrol. Please see 3 prescriptions that we printed for you. Take these as prescribed. Follow-up with your PCP if no better in 5-7 days, sooner if worse  He voiced understanding    Lajean Manes, MD 04/24/17 1123

## 2017-04-17 NOTE — Discharge Instructions (Addendum)
Chest x-ray shows severe bronchitis but no definite pneumonia. Today, we gave you an antibiotic shot of Rocephin and a cortisone shot of Depo-Medrol. Please see 3 prescriptions that we printed for you. Take these as prescribed. Follow-up with your PCP if no better in 5-7 days, sooner if worse

## 2017-05-03 ENCOUNTER — Emergency Department
Admission: EM | Admit: 2017-05-03 | Discharge: 2017-05-03 | Disposition: A | Discharge status: Home / Self Care | Payer: BLUE CROSS/BLUE SHIELD | Source: Home / Self Care | Attending: Family Medicine | Admitting: Family Medicine

## 2017-05-03 ENCOUNTER — Emergency Department (INDEPENDENT_AMBULATORY_CARE_PROVIDER_SITE_OTHER): Payer: BLUE CROSS/BLUE SHIELD

## 2017-05-03 ENCOUNTER — Encounter: Payer: Self-pay | Admitting: *Deleted

## 2017-05-03 DIAGNOSIS — B9789 Other viral agents as the cause of diseases classified elsewhere: Secondary | ICD-10-CM

## 2017-05-03 DIAGNOSIS — R070 Pain in throat: Secondary | ICD-10-CM

## 2017-05-03 DIAGNOSIS — J069 Acute upper respiratory infection, unspecified: Secondary | ICD-10-CM | POA: Diagnosis not present

## 2017-05-03 DIAGNOSIS — R05 Cough: Secondary | ICD-10-CM | POA: Diagnosis not present

## 2017-05-03 DIAGNOSIS — J029 Acute pharyngitis, unspecified: Secondary | ICD-10-CM

## 2017-05-03 LAB — POCT CBC W AUTO DIFF (K'VILLE URGENT CARE)

## 2017-05-03 NOTE — Discharge Instructions (Signed)
°  You may take  acetaminophen every 4-6 hours or in combination with ibuprofen 400-600mg  every 6-8 hours as needed for pain, inflammation, and fever. (You may take Aleve as indicated on product packaging instead of Ibuprofen)  Be sure to drink at least eight 8oz glasses of water to stay well hydrated and get at least 8 hours of sleep at night, preferably more while sick.

## 2017-05-03 NOTE — ED Provider Notes (Signed)
[facility]    CSN: [id number] Arrival date & time: 05/03/17  1443     History   Chief Complaint Chief Complaint  Patient presents with  . Sore Throat  . Cough    HPI Roberto Kim is a 51 y.o. male.   HPI  Roberto Kim is a 51 y.o. male presenting to UC with c/o gradually worsening productive cough over the last 2-3 days.  He was seen at [facility] on 04/17/17, dx with bronchitis, treated with IM rocephin and methylprednisolone as well as discharged home with prednisone and azithromycin.  He was feeling much improved for about 3 days, then current cough returned.  He also reports sore throat "like a lump in my throat" and fatigued.  Co-workers have also mentioned he does not look like he feels well. Denies fever, chills, n/v/d.  He has been around multiple people who have been sick, "it's hard to tell who was sick first." no recent travel. He has not received the flu vaccine this season.     Past Medical History:  Diagnosis Date  . Arthritis   . Essential hypertension   . Hyperglycemia   . Hyperlipidemia   . Hypertension   . 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 OSA (obstructive sleep apnea)   . Sleep apnea    no cpap     Patient Active Problem List   Diagnosis Date Noted  . Obese 05/22/2014  . 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 Preop cardiovascular exam 05/08/2014  . HTN (hypertension) 05/08/2014  . Elevated lipids 05/08/2014  . Obesity hypoventilation syndrome (HCC) 05/08/2014  . Arthritis 05/08/2014    Past Surgical History:  Procedure Laterality Date  . APPENDECTOMY    . HIGH TIBIAL OSTEOTOMY    . pins in right hand     . right hand     pins  . TIBIA OSTEOTOMY     left knee   . TOTAL KNEE ARTHROPLASTY Left 05/21/2014   Procedure: LEFT TOTAL KNEE ARTHROPLASTY, SCREW REMOVAL LEFT KNEE;  Surgeon: Shelda Pal, MD;  Location: [facility];  Service: Orthopedics;  Laterality: Left;  Marland Kitchen VASECTOMY         Home Medications    Prior  to Admission medications   Medication Sig Start Date End Date Taking? Authorizing Provider  aspirin EC 325 MG EC tablet Take 1 tablet (325 mg total) by mouth 2 (two) times daily. 05/22/14   Lanney Gins, PA-C  CRESTOR 10 MG tablet Take 10 mg by mouth daily. 04/03/14   [provider]  docusate sodium 100 MG CAPS Take 100 mg by mouth 2 (two) times daily. 05/22/14   Lanney Gins, PA-C  ferrous sulfate 325 (65 FE) MG tablet Take 1 tablet (325 mg total) by mouth 3 (three) times daily after meals. 05/22/14   Lanney Gins, PA-C  fluticasone (FLONASE) 50 MCG/ACT nasal spray Place 1 spray into both nostrils at bedtime.    [provider]  losartan (COZAAR) 50 MG tablet Take 50 mg by mouth every morning.    [provider]  losartan (COZAAR) 50 MG tablet Take 50 mg by mouth daily. 02/27/14   [provider]  methocarbamol (ROBAXIN) 500 MG tablet Take 1 tablet (500 mg total) by mouth every 6 (six) hours as needed for muscle spasms. 05/22/14   Lanney Gins, PA-C  OVER THE COUNTER MEDICATION Place 1 drop into both eyes daily as needed (allergies/cat hair in eyes.). Allergy eye drops.-  Napcon    [provider]  polyethylene glycol (MIRALAX / GLYCOLAX) packet Take 17 g by mouth 2 (two) times daily. 05/22/14   Lanney Gins, PA-C  PRESCRIPTION MEDICATION Inject 1 each into the skin every 14 (fourteen) days. Allergy shot.    [provider]  promethazine-codeine (PHENERGAN WITH CODEINE) 6.25-10 MG/5ML syrup Take 1-2 teaspoons every 6 hours as needed for cough. May cause drowsiness. 04/17/17   Lajean Manes, MD  rosuvastatin (CRESTOR) 10 MG tablet Take 10 mg by mouth every morning.    [provider]  sildenafil (REVATIO) 20 MG tablet Take 20 mg by mouth as needed.    [provider]  sildenafil (REVATIO) 20 MG tablet Take 40 mg by mouth. As needed 09/06/13   [provider]    Family History Family History  Problem Relation  Age of Onset  . Hypertension Mother   . Hypertension Father     Social History Social History  Substance Use Topics  . Smoking status: Never Smoker  . Smokeless tobacco: Never Used  . Alcohol use Yes     Comment: occasional      Allergies   Ace inhibitors and Statins   Review of Systems Review of Systems  Constitutional: Positive for fatigue. Negative for chills and fever.  HENT: Positive for congestion, rhinorrhea and sore throat. Negative for ear pain, trouble swallowing and voice change.   Respiratory: Positive for cough. Negative for shortness of breath.   Cardiovascular: Negative for chest pain and palpitations.  Gastrointestinal: Negative for abdominal pain, diarrhea, nausea and vomiting.  Musculoskeletal: Negative for arthralgias, back pain and myalgias.  Skin: Negative for rash.  Neurological: Positive for weakness (generalized) and headaches. Negative for dizziness and light-headedness.     Physical Exam Triage Vital Signs ED Triage Vitals  Enc Vitals Group     BP 05/03/17 1514 137/90     Pulse Rate 05/03/17 1514 89     Resp 05/03/17 1514 16     Temp 05/03/17 1514 98.7 F (37.1 C)     Temp Source 05/03/17 1514 Oral     SpO2 05/03/17 1514 96 %     Weight 05/03/17 1515 297 lb (134.7 kg)     Height --      Head Circumference --      Peak Flow --      Pain Score 05/03/17 1515 0     Pain Loc --      Pain Edu? --      Excl. in GC? --    No data found.   Updated Vital Signs BP 137/90 (BP Location: Left Arm)   Pulse 89   Temp 98.7 F (37.1 C) (Oral)   Resp 16   Wt 297 lb (134.7 kg)   SpO2 96%   BMI 39.18 kg/m   Visual Acuity Right Eye Distance:   Left Eye Distance:   Bilateral Distance:    Right Eye Near:   Left Eye Near:    Bilateral Near:     Physical Exam  Constitutional: He is oriented to person, place, and time. He appears well-developed and well-nourished. No distress.  HENT:  Head: Normocephalic and atraumatic.  Right Ear:  Tympanic membrane normal.  Left Ear: Tympanic membrane normal.  Nose: Nose normal.  Mouth/Throat: Uvula is midline and mucous membranes are normal. Posterior oropharyngeal erythema present. No oropharyngeal exudate, posterior oropharyngeal edema or tonsillar abscesses.  Eyes: EOM are normal.  Neck: Normal range of motion. Neck supple. No thyromegaly  present.  Cardiovascular: Normal rate and regular rhythm.   Pulmonary/Chest: Effort normal and breath sounds normal. No stridor. No respiratory distress. He has no wheezes. He has no rales.  Musculoskeletal: Normal range of motion.  Lymphadenopathy:    He has cervical adenopathy.  Neurological: He is alert and oriented to person, place, and time.  Skin: Skin is warm and dry. He is not diaphoretic.  Psychiatric: He has a normal mood and affect. His behavior is normal.  Nursing note and vitals reviewed.    UC Treatments / Results  Labs (all labs ordered are listed, but only abnormal results are displayed) Labs Reviewed  COMPLETE METABOLIC PANEL WITH GFR  TSH  POCT CBC W AUTO DIFF ([facility])    EKG  EKG Interpretation None       Radiology Dg Neck Soft Tissue  Result Date: 05/03/2017 CLINICAL DATA:  Persistent cough for 2-3 weeks, sore throat EXAM: NECK SOFT TISSUES - 1+ VIEW COMPARISON:  None. FINDINGS: The cervical vertebrae are normal alignment with relatively normal intervertebral disc spaces. No prevertebral soft tissue swelling is seen. No hypopharyngeal distention is noted and the epiglottis is unremarkable. IMPRESSION: Negative. Electronically Signed   By: Dwyane Dee M.D.   On: 05/03/2017 15:53   Dg Chest 2 View  Result Date: 05/03/2017 CLINICAL DATA:  Persistent cough for 2-3 weeks, sore throat EXAM: CHEST  2 VIEW COMPARISON:  Chest x-ray of 04/17/2017 FINDINGS: No active infiltrate or effusion is seen. Mediastinal and hilar contours are unremarkable. The heart is within normal limits in size. No bony  abnormality is seen. IMPRESSION: No active cardiopulmonary disease. Electronically Signed   By: Dwyane Dee M.D.   On: 05/03/2017 15:52    Procedures Procedures (including critical care time)  Medications Ordered in UC Medications - No data to display   Initial Impression / Assessment and Plan / UC Course  I have reviewed the triage vital signs and the nursing notes.  Pertinent labs & imaging results that were available during my care of the patient were reviewed by me and considered in my medical decision making (see chart for details).     Imaging unremarkable. CBC: WNL CMP and TSH sent to lab due to c/o generalized weakness and fatigue Advised pt symptoms most likely due to viral illness picked up after most recent illness he was treated for 2 weeks ago. No indication for additional antibiotics at this time. Encouraged fluids, rest, acetaminophen and ibuprofen. F/u with PCP in 1 week if not improving.   Final Clinical Impressions(s) / UC Diagnoses   Final diagnoses:  Throat pain  Viral URI with cough    New Prescriptions Discharge Medication List as of 05/03/2017  4:05 PM       Controlled Substance Prescriptions [place] Controlled Substance Registry consulted? Not Applicable   Rolla Plate 05/03/17 1734

## 2017-05-03 NOTE — ED Triage Notes (Signed)
Patient was seen 2 weeks ago and finished treatment. He improved but 3 days ago productive cough worsened and c/o a "bruised feeling" in the bottom of his throat. He also c/o dark circles under both eyes.

## 2017-05-04 ENCOUNTER — Telehealth: Payer: Self-pay | Admitting: *Deleted

## 2017-05-04 LAB — COMPLETE METABOLIC PANEL WITH GFR
AG Ratio: 1.8 (calc) (ref 1.0–2.5)
ALT: 39 U/L (ref 9–46)
AST: 37 U/L — ABNORMAL HIGH (ref 10–35)
Albumin: 4.7 g/dL (ref 3.6–5.1)
Alkaline phosphatase (APISO): 59 U/L (ref 40–115)
BUN: 25 mg/dL (ref 7–25)
CO2: 26 mmol/L (ref 20–32)
Calcium: 9.5 mg/dL (ref 8.6–10.3)
Chloride: 101 mmol/L (ref 98–110)
Creat: 1.04 mg/dL (ref 0.70–1.33)
GFR, Est African American: 95 mL/min/{1.73_m2} (ref >=60)
GFR, Est Non African American: 82 mL/min/{1.73_m2} (ref >=60)
Globulin: 2.6 g/dL (calc) (ref 1.9–3.7)
Glucose, Bld: 88 mg/dL (ref 65–99)
Potassium: 4.5 mmol/L (ref 3.5–5.3)
Sodium: 138 mmol/L (ref 135–146)
Total Bilirubin: 0.7 mg/dL (ref 0.2–1.2)
Total Protein: 7.3 g/dL (ref 6.1–8.1)

## 2017-05-04 LAB — TSH: TSH: 1.85 mIU/L (ref 0.40–4.50)

## 2017-05-04 NOTE — Telephone Encounter (Signed)
LM with lab results and to call back if he has any questions or concerns.  

## 2018-10-01 IMAGING — DX DG CHEST 2V
2 series · 2 of 2 positions shown · non-contrast
Comparison: None.

CLINICAL DATA: Cough and shortness of breath.  Weakness.

EXAM:
CHEST  2 VIEW

[chest pa]
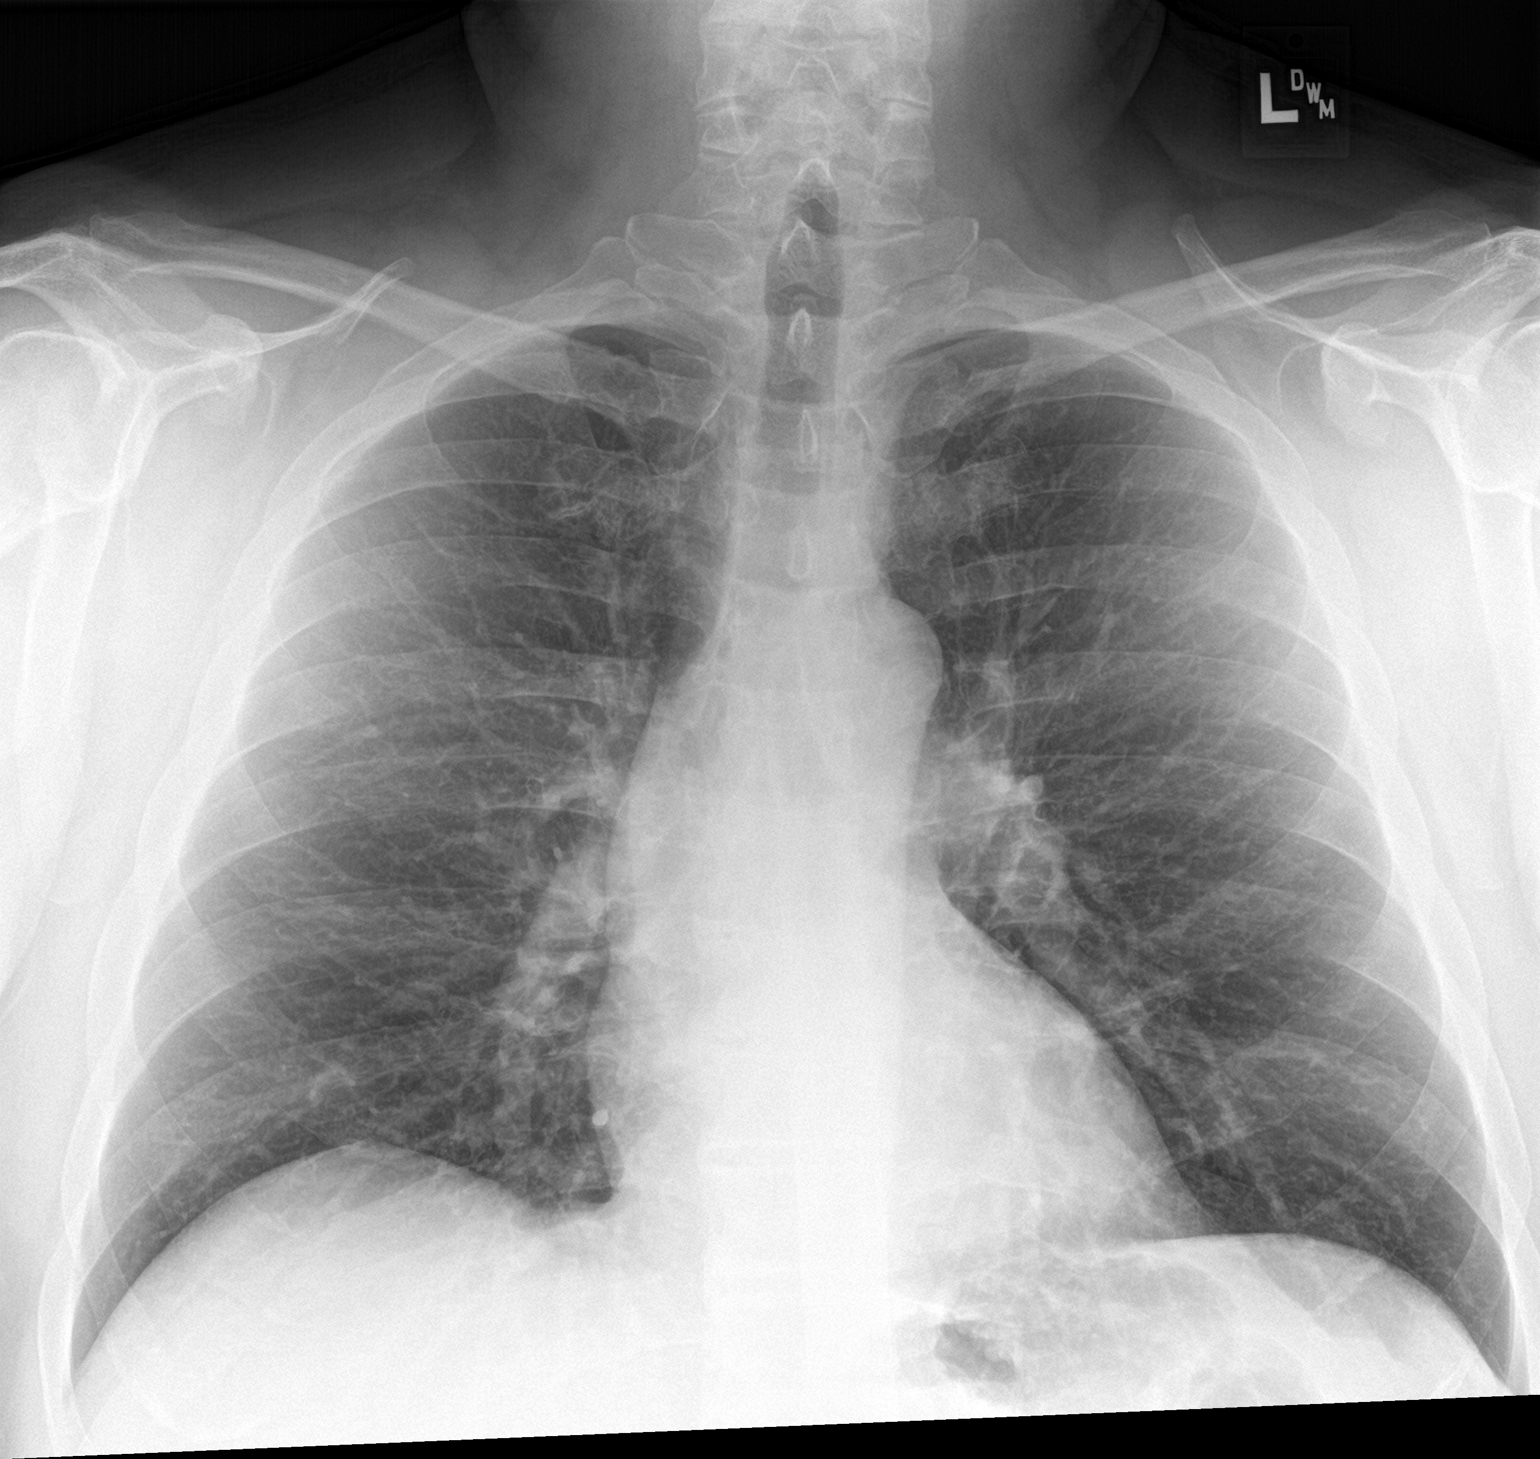

[chest lat]
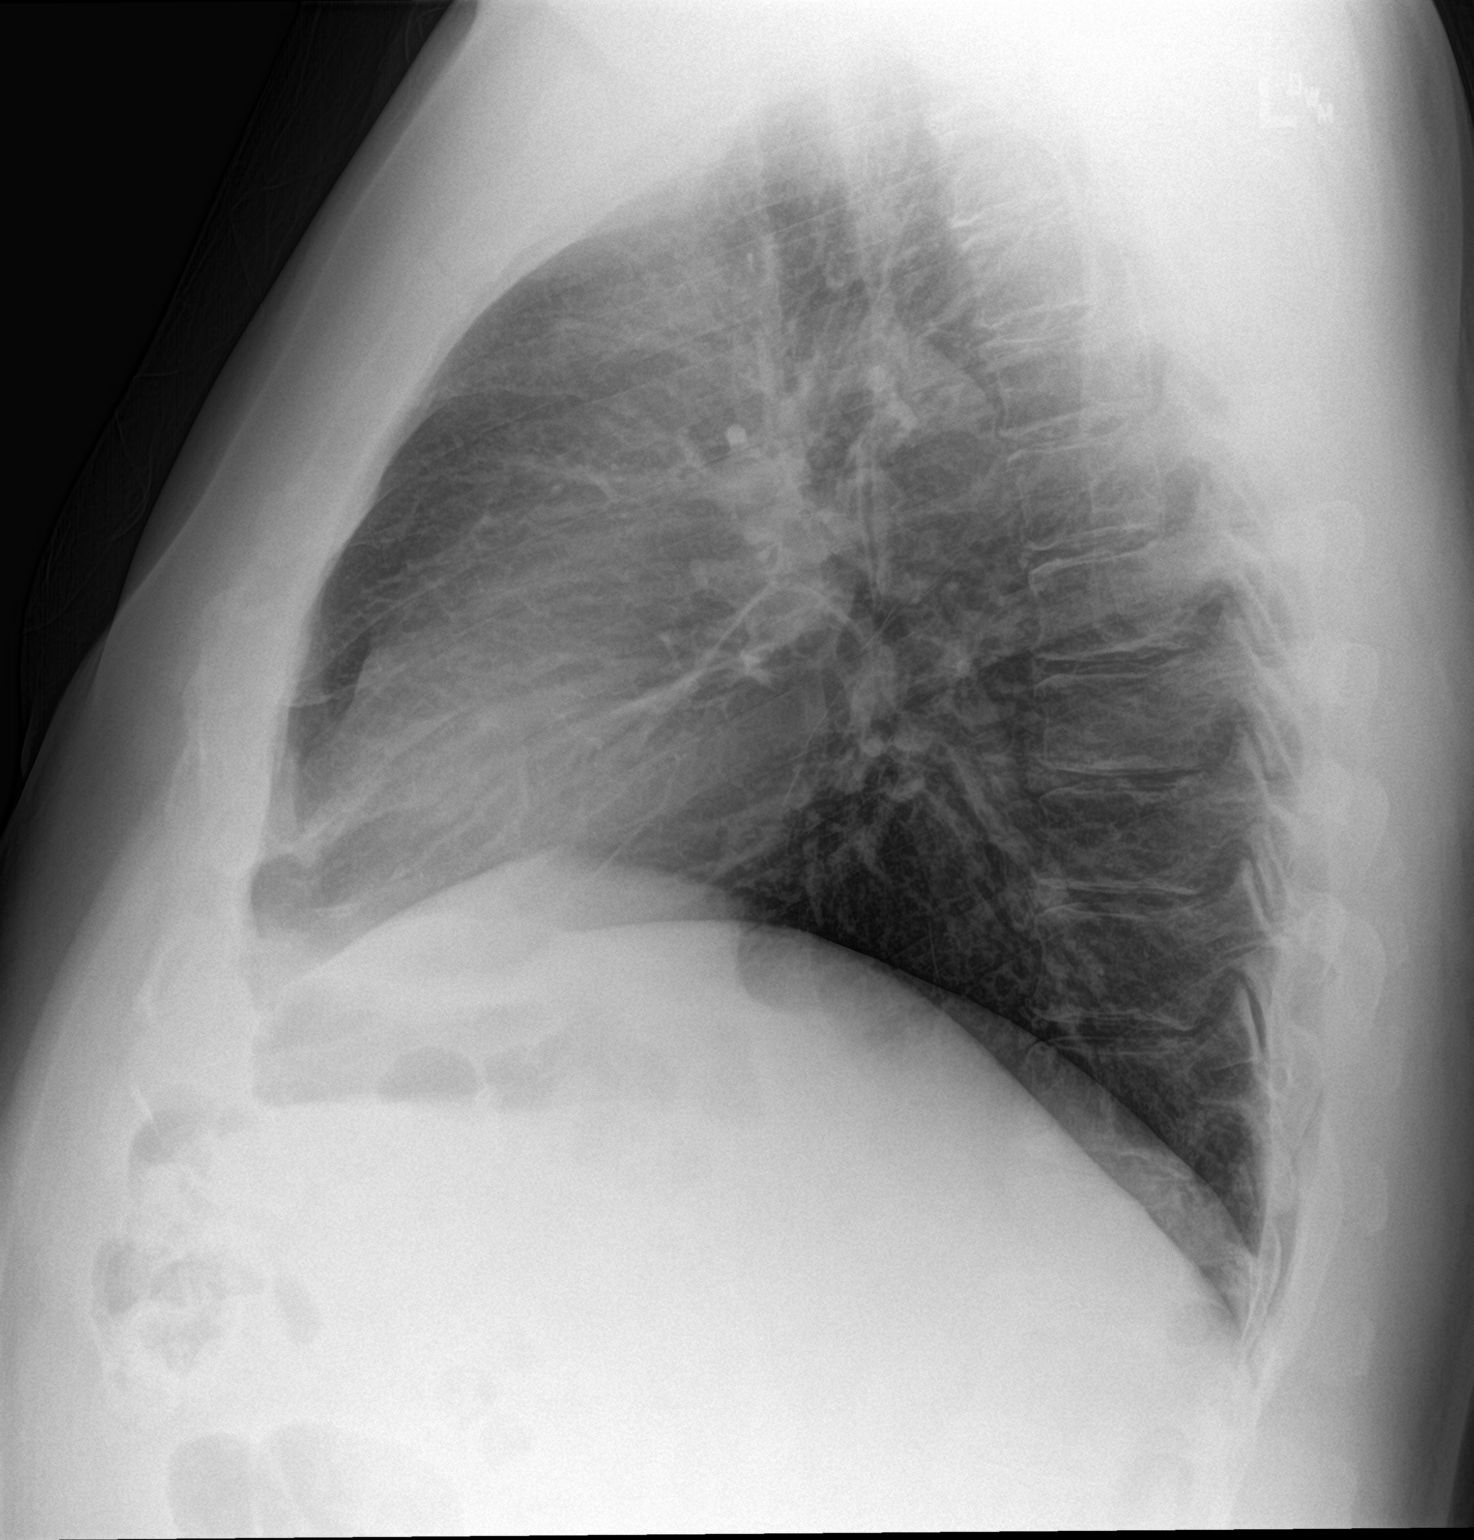

[2 of 2 positions shown; findings below may reference images not displayed]

FINDINGS: Normal sized heart. Clear lungs. Mild diffuse peribronchial
thickening. Unremarkable bones.
IMPRESSION: Mild bronchitic changes.

## 2018-10-17 IMAGING — DX DG CHEST 2V
2 series · 2 of 2 positions shown · non-contrast
Comparison: Chest x-ray of 04/17/2017

CLINICAL DATA: Persistent cough for 2-3 weeks, sore throat

EXAM:
CHEST  2 VIEW

[chest pa]
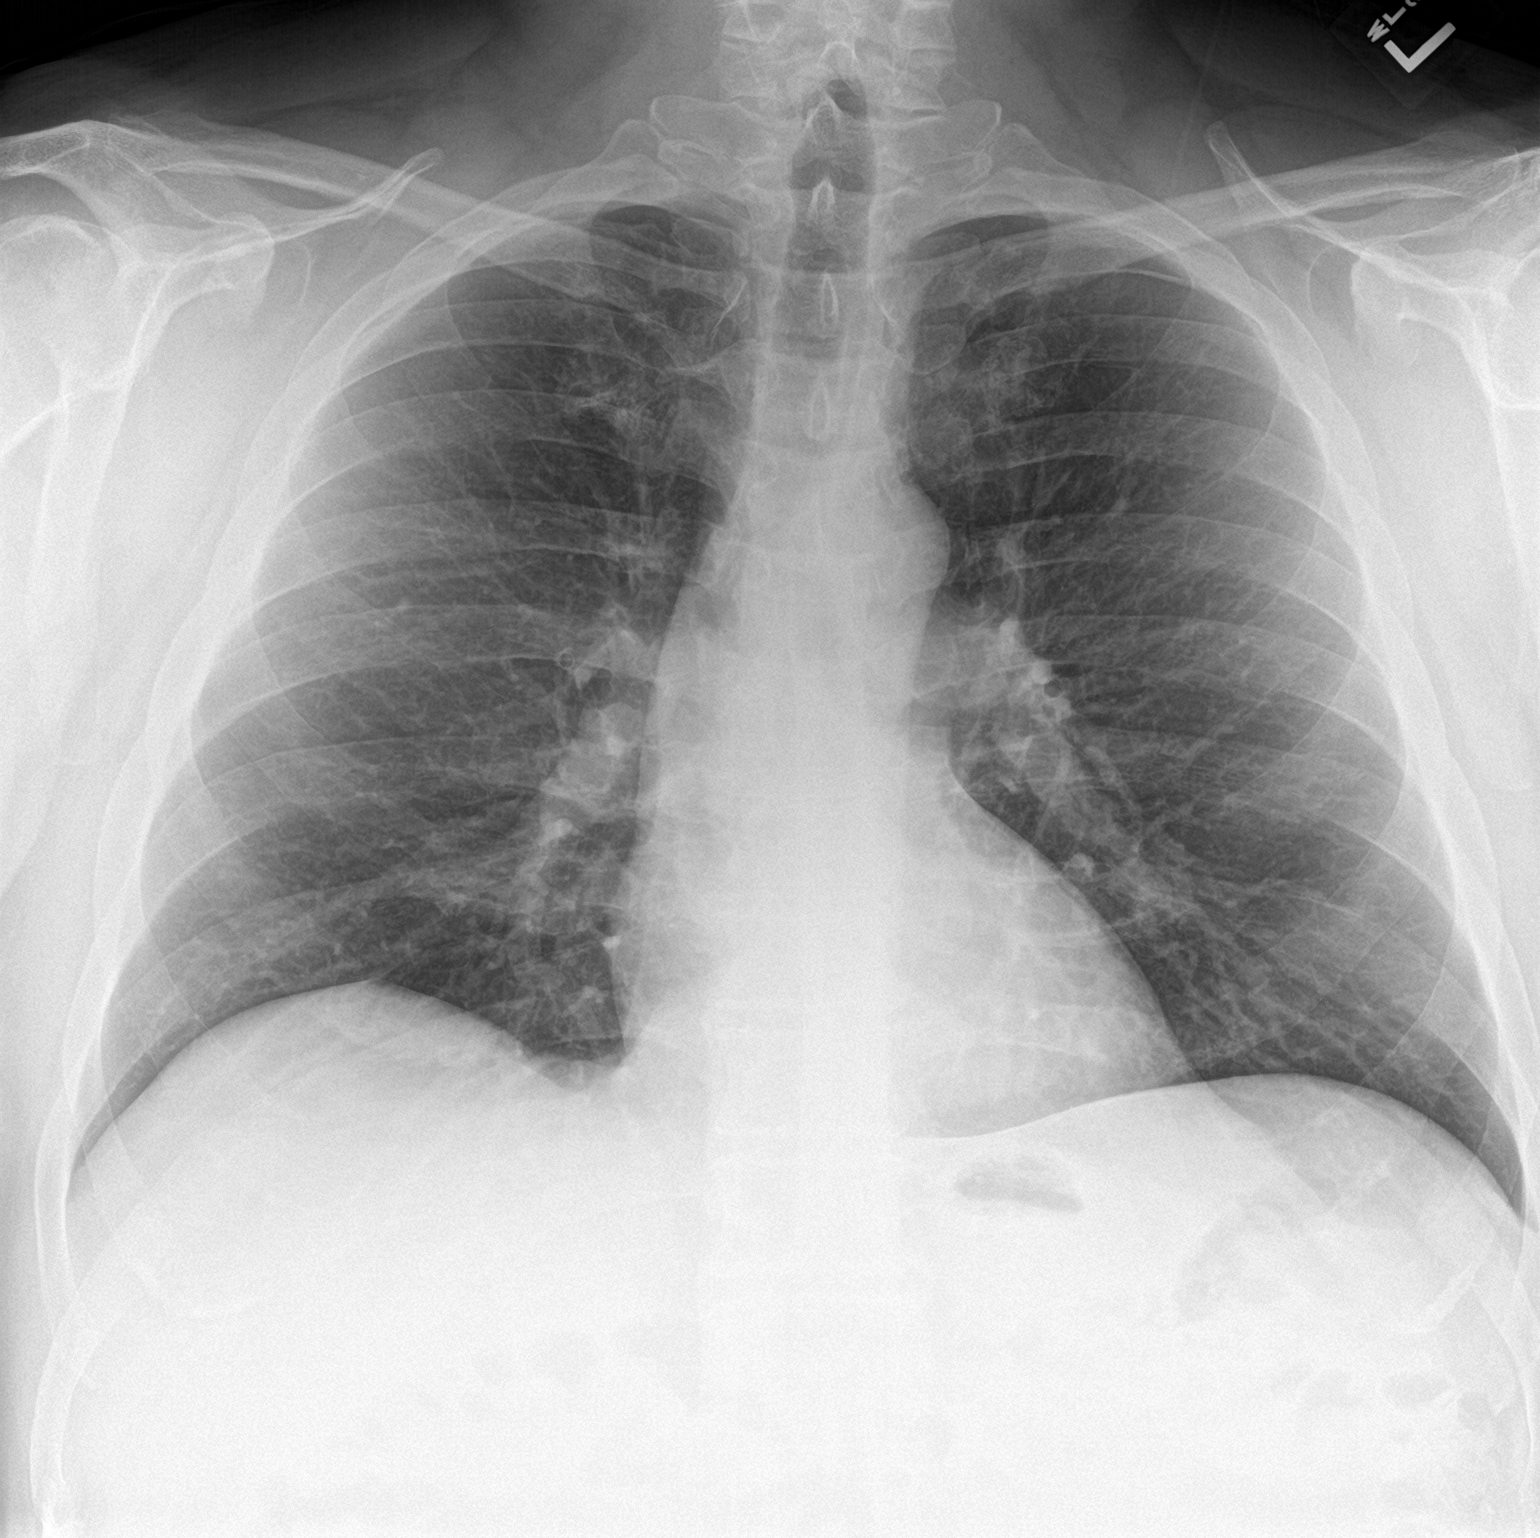

[chest lat]
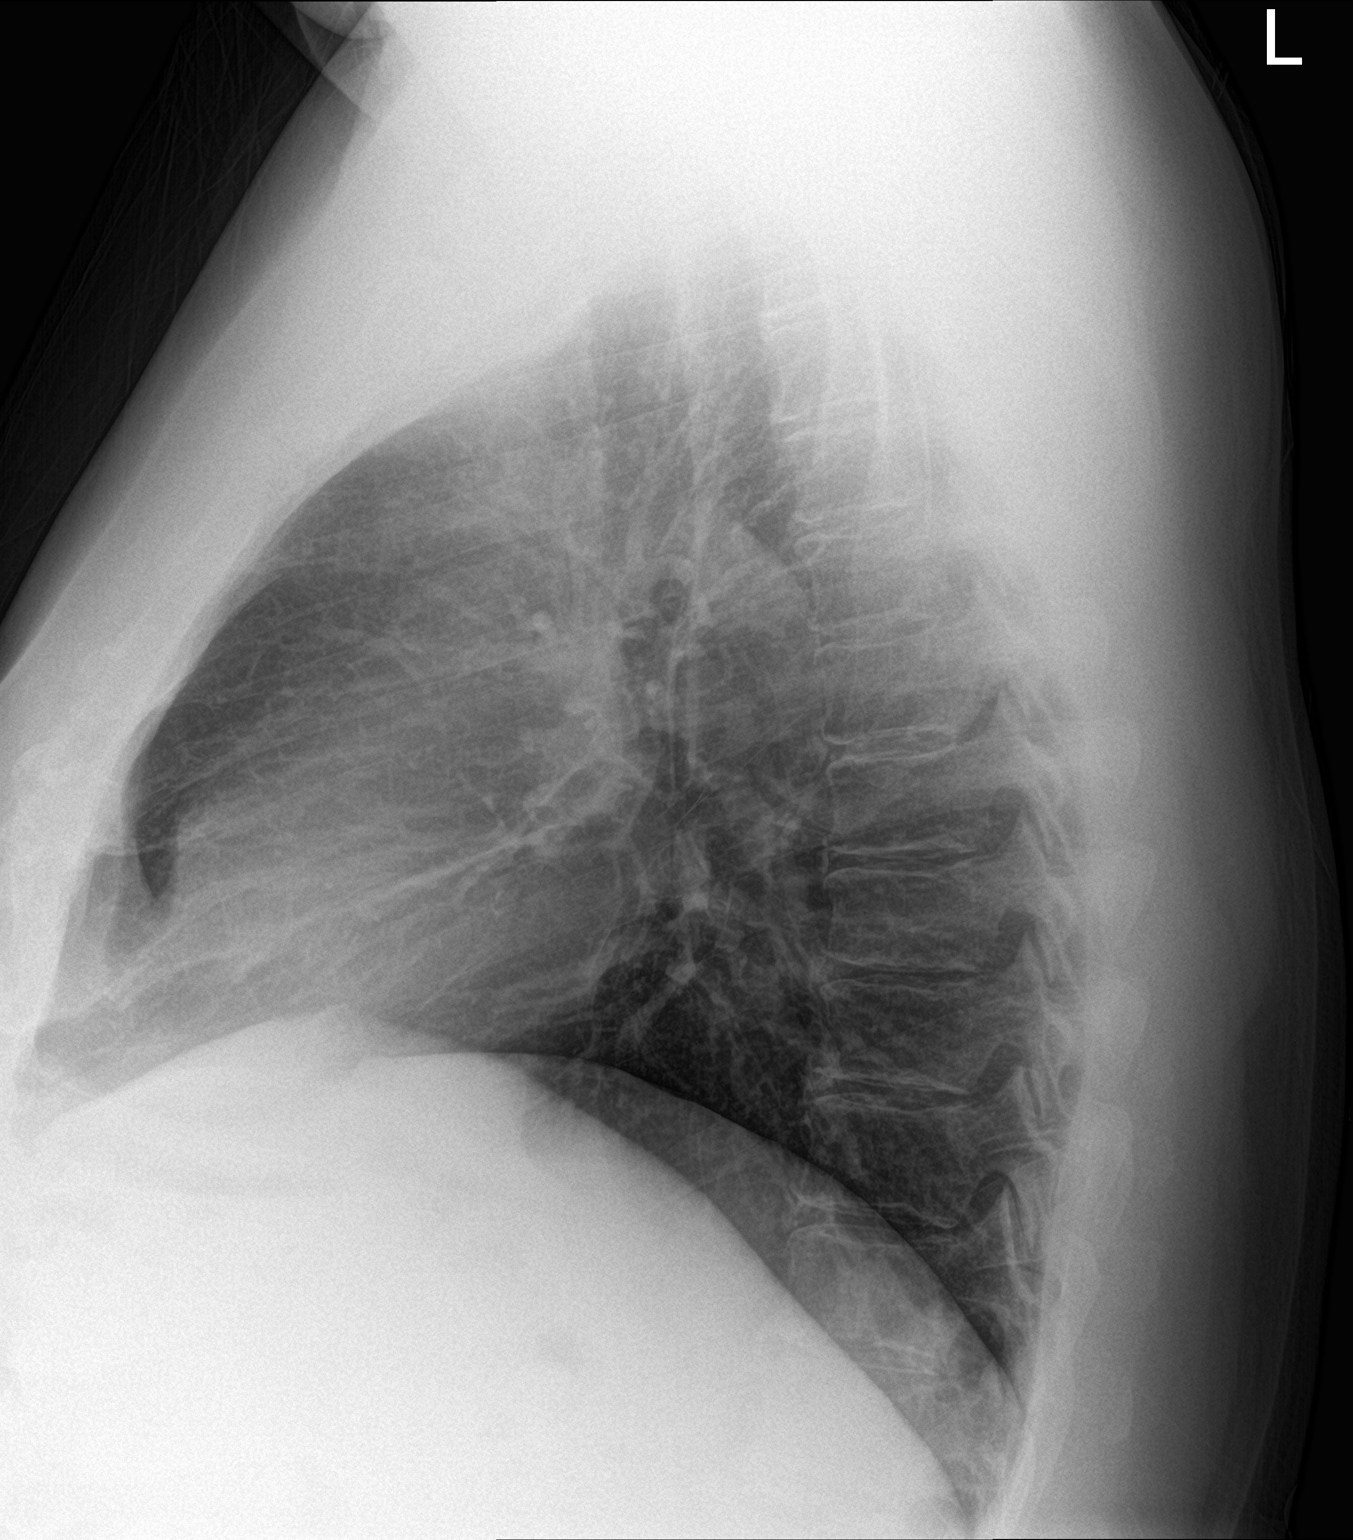

[2 of 2 positions shown; findings below may reference images not displayed]

FINDINGS: No active infiltrate or effusion is seen. Mediastinal and hilar
contours are unremarkable. The heart is within normal limits in
size. No bony abnormality is seen.
IMPRESSION: No active cardiopulmonary disease.

## 2019-02-26 ENCOUNTER — Ambulatory Visit: Payer: BLUE CROSS/BLUE SHIELD | Attending: Internal Medicine | Primary: Internal Medicine

## 2019-03-01 ENCOUNTER — Ambulatory Visit: Attending: Internal Medicine | Primary: Internal Medicine

## 2019-03-01 ENCOUNTER — Ambulatory Visit
Admit: 2019-03-01 | Discharge: 2019-03-01 | Payer: BLUE CROSS/BLUE SHIELD | Attending: Internal Medicine | Primary: Internal Medicine

## 2019-03-01 DIAGNOSIS — Z0000 Encounter for general adult medical examination without abnormal findings: Secondary | ICD-10-CM

## 2019-03-01 MED ORDER — VALSARTAN 160 MG TAB
160 mg | ORAL_TABLET | Freq: Every day | ORAL | 0 refills | Status: AC
Start: 2019-03-01 — End: ?

## 2019-03-01 MED ORDER — ATORVASTATIN 40 MG TAB
40 mg | ORAL_TABLET | Freq: Every day | ORAL | 0 refills | Status: DC
Start: 2019-03-01 — End: 2019-05-23

## 2019-03-01 NOTE — Progress Notes (Signed)
HISTORY OF PRESENT ILLNESS  Ronald Snow is a 53 y.o. male. Patient with a history of HTN, vasectomy, obesity and knee arthroplasty.   Was seen today to establish care. Reports that he and hist wife just moved to the area for work. Was being followed by a PCP in the past   Reports seeing a urologist in the past for ER. Now resolved.   Saw ORTHO in the past for a knee arthropathy, but currently has not complaints   Taking Lipitor and Valsartan but has been out for a month. Needs a refill   Has optometry a year ago. Wears glasses daily   Has recently started a weight loss app. Last lost 20 lbs in 4 weeks. Has increased on exercise, by walking daily   Has gotten a colonoscopy, 10 year follow up. Reports no changes in stool   Receives the flu shot yearly. Discussed Shingrex   Visit Vitals  BP (!) 130/98 (BP 1 Location: Left arm, BP Patient Position: Sitting)   Pulse 66   Temp 98 ??F (36.7 ??C)   Resp 16   Ht 6\' 1"  (1.854 m)   Wt 281 lb (127.5 kg)   SpO2 97%   BMI 37.07 kg/m??     Past Medical History:   Diagnosis Date   ??? Hx of appendectomy 2010   ??? Hypertension    ??? Knee joint replacement status 2013     Past Surgical History:   Procedure Laterality Date   ??? HX APPENDECTOMY     ??? HX UROLOGICAL     ??? HX VASECTOMY  2000     Family History   Problem Relation Age of Onset   ??? Hypertension Mother    ??? Hypertension Father    ??? Heart Disease Paternal Grandfather    ??? Hypertension Paternal Grandfather      Outpatient Encounter Medications as of 03/01/2019   Medication Sig Dispense Refill   ??? atorvastatin (LIPITOR) 40 mg tablet Take 1 Tab by mouth daily. 90 Tab 0   ??? valsartan (DIOVAN) 160 mg tablet Take 1 Tab by mouth daily. 90 Tab 0   ??? [DISCONTINUED] atorvastatin (LIPITOR) 40 mg tablet      ??? [DISCONTINUED] valsartan (DIOVAN) 160 mg tablet Take  by mouth daily.       No facility-administered encounter medications on file as of 03/01/2019.      HPI    Review of Systems   Constitutional: Negative for chills and fever.    Respiratory: Negative.    Gastrointestinal: Negative.    Genitourinary: Negative.    Musculoskeletal: Positive for joint pain. Negative for falls.   Neurological: Negative.    Psychiatric/Behavioral: Negative.        Physical Exam  Vitals signs and nursing note reviewed.   Constitutional:       Appearance: He is obese.   HENT:      Head: Normocephalic and atraumatic.   Eyes:      Pupils: Pupils are equal, round, and reactive to light.   Neck:      Musculoskeletal: Neck supple.   Cardiovascular:      Rate and Rhythm: Normal rate and regular rhythm.   Pulmonary:      Effort: Pulmonary effort is normal. No respiratory distress.      Breath sounds: Normal breath sounds.   Abdominal:      General: Bowel sounds are normal.      Palpations: Abdomen is soft.   Musculoskeletal: Normal range of  motion.   Skin:     General: Skin is warm.   Neurological:      Mental Status: He is alert and oriented to person, place, and time.   Psychiatric:         Mood and Affect: Mood normal.         ASSESSMENT and PLAN  Diagnoses and all orders for this visit:    1. Encounter for medical examination to establish care  -     METABOLIC PANEL, COMPREHENSIVE  -     CBC WITH AUTOMATED DIFF  -     LIPID PANEL  -     HEMOGLOBIN A1C WITH EAG  -     MICROALBUMIN, UR, RAND W/ MICROALB/CREAT RATIO    2. Severe obesity (HCC)    3. Hypertension, unspecified type  -     atorvastatin (LIPITOR) 40 mg tablet; Take 1 Tab by mouth daily.  -     valsartan (DIOVAN) 160 mg tablet; Take 1 Tab by mouth daily.      Follow-up and Dispositions    ?? Return in about 6 months (around 09/01/2019), or if symptoms worsen or fail to improve.       current treatment plan is effective, no change in therapy  lab results and schedule of future lab studies reviewed with patient  reviewed diet, exercise and weight control  cardiovascular risk and specific lipid/LDL goals reviewed  reviewed medications and side effects in detail

## 2019-03-01 NOTE — Progress Notes (Signed)
Health Maintenance Due   Topic Date Due   ??? Lipid Screen  01/24/1975   ??? DTaP/Tdap/Td series (1 - Tdap) 01/23/1986   ??? Shingrix Vaccine Age 54> (1 of 2) 01/24/2015   ??? FOBT Q1Y Age 54-75  01/24/2015   ??? Influenza Age 9 to Adult  02/17/2019       Chief Complaint   Patient presents with   ??? Medication Refill   ??? New Patient     establishing care       1. Have you been to the ER, urgent care clinic since your last visit?  Hospitalized since your last visit?No    2. Have you seen or consulted any other health care providers outside of the [facility] since your last visit?  Include any pap smears or colon screening. No    3) Do you have an Advance Directive on file? no    4) Are you interested in receiving information on Advance Directives? NO      Patient is accompanied by self I have received verbal consent from Ronald Snow to discuss any/all medical information while they are present in the room.

## 2019-03-01 NOTE — Progress Notes (Signed)
Health Maintenance Due   Topic Date Due   . Lipid Screen  01/24/1975   . DTaP/Tdap/Td series (1 - Tdap) 01/23/1986   . Shingrix Vaccine Age 54> (1 of 2) 01/24/2015   . FOBT Q1Y Age 40-75  01/24/2015   . Influenza Age 47 to Adult  02/17/2019       Chief Complaint   Patient presents with   . Medication Refill   . New Patient     establishing care       1. Have you been to the ER, urgent care clinic since your last visit?  Hospitalized since your last visit?No    2. Have you seen or consulted any other health care providers outside of the [facility] since your last visit?  Include any pap smears or colon screening. No    3) Do you have an Advance Directive on file? no    4) Are you interested in receiving information on Advance Directives? NO      Patient is accompanied by self I have received verbal consent from Ronald Snow to discuss any/all medical information while they are present in the room.

## 2019-05-23 ENCOUNTER — Encounter

## 2019-05-23 MED ORDER — ATORVASTATIN 40 MG TAB
40 mg | ORAL_TABLET | ORAL | 0 refills | Status: AC
Start: 2019-05-23 — End: ?

## 2019-08-30 ENCOUNTER — Encounter: Attending: Internal Medicine | Primary: Internal Medicine

## 2021-12-31 ENCOUNTER — Ambulatory Visit: Admit: 2021-12-31 | Discharge: 2021-12-31 | Payer: PRIVATE HEALTH INSURANCE | Attending: Specialist

## 2021-12-31 ENCOUNTER — Ambulatory Visit: Admit: 2021-12-31 | Payer: PRIVATE HEALTH INSURANCE

## 2021-12-31 DIAGNOSIS — M1711 Unilateral primary osteoarthritis, right knee: Secondary | ICD-10-CM

## 2021-12-31 NOTE — Progress Notes (Signed)
Ronald Snow (DOB: [birth year]) is a 57 y.o. male, patient, here for evaluation of the following chief complaint(s):  Surgical Consult (Discuss right knee replacement surgery )       HPI:    Complaint right knee pain.  For the last 5 years he has had progressively worsening knee pain.  He over those 5 years is treated with anti-inflammatories.  He was getting hyaluronic acid injections but they stopped working.  He has just recently had a series of injections that gave him no relief.  He has a job where he is on his feet some of the day this problem is starting to really get in the way.  His left knee replacement which was done 10 years ago is doing very well for him.    Allergies   Allergen Reactions    Ace Inhibitors Cough       Current Outpatient Medications   Medication Sig Dispense Refill    atorvastatin (LIPITOR) 40 MG tablet Take 1 tablet by mouth daily      valsartan (DIOVAN) 160 MG tablet Take 1 tablet by mouth daily       No current facility-administered medications for this visit.       Past Medical History:   Diagnosis Date    Hx of appendectomy 2010    Hypertension     Knee joint replacement status 2013        Past Surgical History:   Procedure Laterality Date    APPENDECTOMY      UROLOGICAL SURGERY      VASECTOMY  2000       Family History   Problem Relation Age of Onset    Heart Disease Paternal Grandfather     Hypertension Paternal Grandfather     Hypertension Father     Hypertension Mother         Social History     Socioeconomic History    Marital status: Married     Spouse name: Not on file    Number of children: Not on file    Years of education: Not on file    Highest education level: Not on file   Occupational History    Not on file   Tobacco Use    Smoking status: Never     Passive exposure: Never    Smokeless tobacco: Never   Substance and Sexual Activity    Alcohol use: Yes    Drug use: Never    Sexual activity: Not on file   Other Topics Concern    Not on file   Social History Narrative     Not on file     Social Determinants of Health     Financial Resource Strain: Not on file   Food Insecurity: Not on file   Transportation Needs: Not on file   Physical Activity: Not on file   Stress: Not on file   Social Connections: Not on file   Intimate Partner Violence: Not on file   Housing Stability: Not on file             Vitals:    There were no vitals filed for this visit.  Body mass index is 37.6 kg/m.    PHYSICAL EXAM:  On exam today his left knee is replaced and stable.  His right knee has a painless right hip.  Right lower extremity is sensate and perfused.  Skin is intact.  Right knee extends to within 5 degrees full extension flexes to  100 or so degrees.  Pain is present medially with a moderate-sized effusion.  There is no knee instability.    IMAGING:    Xray Result (most recent):  XR KNEE RIGHT (3 VIEWS) 12/31/2021    Narrative  Right knee 3 views.  His left knee is replaced.  Right knee shows severe medial compartment bone against bone.  There are large medial osteophytes and large subchondral cyst.  Patellofemoral joint is also bone to bone.      ASSESSMENT/PLAN:  1. Primary osteoarthritis of right knee  -     XR KNEE RIGHT (3 VIEWS); Future    We discussed continued conservative treatment measures versus total joint replacement.  Symptoms have progressed despite conservative treatment measures outlined above and they desire to proceed with right total knee.    Patient has had symptoms for over 5 years.  They have decline in their activities of daily living with inability to walk long distances.  There has been progressive decline in function.    Discussed risks, benefits, and alternatives in detail, as well  course of rehabilitation.  They will see their primary care physician prior to surgery.  All questions answered.    An electronic signature was used to authenticate this note.  --Birdena Crandall, MD

## 2022-01-12 ENCOUNTER — Encounter

## 2022-03-05 NOTE — Progress Notes (Signed)
[employer] FMLA form faxed [phone]  Claim # [id number]

## 2022-03-26 ENCOUNTER — Encounter

## 2022-03-26 MED ORDER — ASPIRIN 325 MG PO TABS
325 MG | ORAL_TABLET | Freq: Two times a day (BID) | ORAL | 0 refills | Status: AC
Start: 2022-03-26 — End: ?

## 2022-03-26 MED ORDER — SENNA-DOCUSATE SODIUM 8.6-50 MG PO TABS
ORAL_TABLET | Freq: Every day | ORAL | 1 refills | Status: AC
Start: 2022-03-26 — End: ?

## 2022-03-26 MED ORDER — OXYCODONE HCL 5 MG PO TABS
5 MG | ORAL_TABLET | ORAL | 0 refills | Status: AC | PRN
Start: 2022-03-26 — End: 2022-04-02

## 2022-03-26 NOTE — Telephone Encounter (Signed)
Post op medications      RTKA scheduled for 03/29/2022 at [facility]

## 2022-03-31 NOTE — Telephone Encounter (Signed)
Patient called to report he is unable to get ahead of pain, he called LVM after hours, he was able to get a hold of one call doctor who instructed patient to double up on oxycodone, consulted with Dr. Philomena Doheny who agreed with on call doctors instructions, advised patient to alternate between motrin and tylenol in between his oxycodone doses, also instructed patient to continue icing and elevating knee, patient verbalized understanding

## 2022-04-05 ENCOUNTER — Encounter

## 2022-04-05 NOTE — Telephone Encounter (Signed)
Patient called for Oxychodone refill         RTKA 03/29/22 at [facility]

## 2022-04-06 MED ORDER — OXYCODONE HCL 5 MG PO TABS
5 MG | ORAL_TABLET | ORAL | 0 refills | Status: AC | PRN
Start: 2022-04-06 — End: 2022-04-13

## 2022-04-15 ENCOUNTER — Encounter

## 2022-04-15 MED ORDER — OXYCODONE HCL 5 MG PO TABS
5 MG | ORAL_TABLET | ORAL | 0 refills | Status: AC | PRN
Start: 2022-04-15 — End: 2022-04-22

## 2022-04-15 NOTE — Telephone Encounter (Signed)
Oxycodone refill request

## 2022-04-18 ENCOUNTER — Encounter

## 2022-04-23 ENCOUNTER — Ambulatory Visit
Admit: 2022-04-23 | Discharge: 2022-04-23 | Payer: PRIVATE HEALTH INSURANCE | Attending: Specialist | Primary: Family Medicine

## 2022-04-23 ENCOUNTER — Ambulatory Visit: Admit: 2022-04-23 | Payer: PRIVATE HEALTH INSURANCE | Primary: Family Medicine

## 2022-04-23 DIAGNOSIS — Z96651 Presence of right artificial knee joint: Secondary | ICD-10-CM

## 2022-04-23 NOTE — Progress Notes (Signed)
Ronald Snow (DOB: [birth year]) is a 56 y.o. male, patient, here for evaluation of the following chief complaint(s):  Post-Op Check (First post op eval - RTKA 03/29/2022 at [facility] )       HPI:    Status post right total knee.  Doing well.  No issues.        Allergies   Allergen Reactions    Ace Inhibitors Cough       Current Outpatient Medications   Medication Sig Dispense Refill    rosuvastatin (CRESTOR) 20 MG tablet Take 1 tablet by mouth daily      losartan-hydroCHLOROthiazide (HYZAAR) 100-25 MG per tablet Take 1 tablet by mouth daily      aspirin 325 MG tablet Take 1 tablet by mouth in the morning and at bedtime 60 tablet 0    sennosides-docusate sodium (SENOKOT-S) 8.6-50 MG tablet Take 1 tablet by mouth daily 30 tablet 1    atorvastatin (LIPITOR) 40 MG tablet Take 1 tablet by mouth daily      valsartan (DIOVAN) 160 MG tablet Take 1 tablet by mouth daily       No current facility-administered medications for this visit.       Past Medical History:   Diagnosis Date    Hx of appendectomy 2010    Hypertension     Knee joint replacement status 2013        Past Surgical History:   Procedure Laterality Date    APPENDECTOMY      TOTAL KNEE ARTHROPLASTY Right 03/29/2022    by Dr. Jamison Neighbor at [facility])    [place]       Family History   Problem Relation Age of Onset    Heart Disease Paternal Grandfather     Hypertension Paternal Grandfather     Hypertension Father     Hypertension Mother         Social History     Socioeconomic History    Marital status: Married     Spouse name: Not on file    Number of children: Not on file    Years of education: Not on file    Highest education level: Not on file   Occupational History    Not on file   Tobacco Use    Smoking status: Never     Passive exposure: Never    Smokeless tobacco: Never   Substance and Sexual Activity    Alcohol use: Yes    Drug use: Never    Sexual activity: Not on file   Other Topics Concern    Not on file    Social History Narrative    Not on file     Social Determinants of Health     Financial Resource Strain: Not on file   Food Insecurity: Not on file   Transportation Needs: Not on file   Physical Activity: Not on file   Stress: Not on file   Social Connections: Not on file   Intimate Partner Violence: Not on file   Housing Stability: Not on file             Vitals:    There were no vitals filed for this visit.  There is no height or weight on file to calculate BMI.    PHYSICAL EXAM: Exam shows full extension and flexion to over 120 degrees.  Some bruising in his medial calf from his medial release.    IMAGING:  Xray Result (most recent):  XR KNEE RIGHT (3 VIEWS) 04/23/2022    Narrative  Right knee 3 views.  Well-positioned cementless total knee.  No issues are noted.        ASSESSMENT/PLAN:  1. Status post total knee replacement, right  -     Amb External Referral To Physical Therapy  -     XR KNEE RIGHT (3 VIEWS); Future    Doing well post right total knee.  Patient's wife states that this knee was at least 25% easier than his first total knee on the left.  I will see him back in 2 months.  Outpatient PT.  DC aspirin.  Continue with ibuprofen and Tylenol.  Does not need pain medication.    An electronic signature was used to authenticate this note.  --Birdena Crandall, MD

## 2022-04-27 ENCOUNTER — Encounter
Admit: 2022-04-27 | Discharge: 2022-04-27 | Payer: PRIVATE HEALTH INSURANCE | Attending: Rehabilitative and Restorative Service Providers" | Primary: Family Medicine

## 2022-04-27 DIAGNOSIS — G8929 Other chronic pain: Secondary | ICD-10-CM

## 2022-04-27 NOTE — Progress Notes (Cosign Needed)
KNEE EVALUATION    Patient Name: Ronald Snow  Date:04/27/2022  DOB: 1965/10/04  [x]   Patient DOB Verified  Payor: Theme park manager / Plan: [place] / Product Type: *No Product type* /    Total Treatment Time (min): 50  Total Timed Codes (min): 50      Referring Physician:   Zollie Beckers, MD       1. Chronic pain of right knee  2. Difficulty walking  3. Status post total knee replacement, right      SUBJECTIVE  Patient is a 56 year old male referred to physical therapy by Dr. [name] status post right TKR performed on 9/11.  He has been doing well since surgery.  He had home health PT prior to his appointment today.  He has really focused on regaining his extension.  He is having an easier time with flexion.  He does still have peripatellar numbness, has continued to use ice.  He is alternating between ibuprofen and Tylenol, taking oxycodone at night to sleep.  He has weaned from all assistive devices.  No issues with driving.  He can go up stairs without upper extremity support, having expected difficulty descending.  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  Eventually would like to return to the gym, hiking, biking.  He also has a trip to [place] planned for Christmas and would like to be able to walk without a limp by this time.  Of note, he had the left replaced 10 years ago in [place].    Past Medical History:   Diagnosis Date    Hx of appendectomy 2010    Hypertension     Knee joint replacement status 2013      OBJECTIVE  Gait  Ambulates with slightly antalgic gait without assistive device, diminished extension at terminal stance    Functional tests  Able to rise on toes and on heels.  He is unable to maintain single-leg stance for 5 seconds on the right.  Slight lateral shift to the left with squatting but pain-free    Observations  Mild peripatellar effusion.  Incision looks great, no signs of  infection    AROM  0 degrees to 103 degrees.  0 degrees to 118 degrees on the left    PROM  2 degrees hyperextension to approximately 110 degrees with mild discomfort at endrange    Strength  Good early isometric quad contraction.  He can forward straight leg raise without lag.  Resisted hip flexion 5/5, hip abduction 4/5    Flexibility  Restricted  hip flexor, quadriceps, and hamstring mobility noted.    Joint mobility  Restricted superior/inferior patella mobility    Special tests  Negative Bevelyn Buckles' sign    LEFS  34    Treatment:  Performed evaluation (20 minutes).     NMR (10 minutes): NMES 10/10 x 10 with active quad set    Therapeutic exercise (20 minutes): Patellafemoral and tibiofemoral joint mobilization.  Passive range of motion in supine.  Soft tissue mobilization/myofascial release to peripatellar structures, quad, IT band in modified Thomas test position. Provided and instructed the patient in a home exercise program for range of motion and quad/hip strengthening. We discussed patient goals and collaborated about a progressive plan of care.  Reported he would ice at home                ASSESSMENT    Patient presents  with impairments related to gait, swelling, range of motion, quad/hip strength, balance, flexibility, ability to ambulate long distances, negotiate stairs, and participate in desired activity following right TKR.  He is doing very well for 4 weeks postop. He will benefit from skilled outpatient physical therapy services to address above limitations and maximize function.    Short-Term Goals (1 week)  1. Patient will be independent with home exercise program to facilitate recovery.    Long-Term Goals (6-8 weeks)  1. Patient will be a functional ambulator without exacerbation of knee pain from resting baseline values.  2. Patient will demonstrate independent ability to squat to 90 degrees to use the toilet without increased knee pain.  3. Patient will negotiate stairs without exacerbation of  pain from baseline level.  4. Patient will independently perform all self-directed ADLs without exacerbation of pain from baseline.    Plan:    2x weekly. Duration 20 visits.    Interventions to include but are not limited to joint mobilizations, soft tissue mobilization, myofascial release, therapeutic exercise, neuromuscular reeducation, dry needling, taping, and modalities as indicated.    Lyndon Code, PT 04/27/2022  8:43 AM    The referring physician has reviewed and approved this evaluation and plan of care as noted by the electronic signature attached to note.

## 2022-04-29 ENCOUNTER — Ambulatory Visit
Admit: 2022-04-29 | Discharge: 2022-04-29 | Payer: PRIVATE HEALTH INSURANCE | Attending: Rehabilitative and Restorative Service Providers" | Primary: Family Medicine

## 2022-04-29 DIAGNOSIS — G8929 Other chronic pain: Secondary | ICD-10-CM

## 2022-04-29 NOTE — Progress Notes (Signed)
PT DAILY TREATMENT NOTE    Patient Name: Ronald Snow  Date:04/29/2022  DOB: 1966-03-16  [x]   Patient DOB Verified  Payor: Theme park manager / Plan: [place] / Product Type: *No Product type* /    Total Treatment Time (min): 55  Referring Provider: Zollie Beckers, MD    Treatment Area: R knee    SUBJECTIVE  Subjective functional status/changes:   []  No changes reported  Patient states that his home exercises have been going well.    OBJECTIVE    Therapeutic Exercise: (minutes: 25)    PT Exercise Log        Activity/Exercise Date  04/29/22    Activity/Exercise      Nustep L2 10 min.   X   TKE X   Slantboard   X   Balance X   LAQs 3 LBS X                                 Manual Therapy: (minutes: 20) Treatment consisted of grade 3 patellofemoral and tibiofemoral mobilizations.  Soft tissue mobilization of R ITB and distal quad.      Neuromuscular Re-education: (minutes: 10) VMS for stimulation with active strengthening and tactile cueing to VMO in conjunction with quad sets and SAQ 2 LBS      ASSESSMENT  []   See Plan of Care  []   See progress note/recertification  [x]   Patient will continue to benefit from skilled therapy to address remaining functional deficits:     Patient has progressed very well with mobility.  He does lack a few degrees of active knee extension during ambulation.  We will continue plan with focus on restoring quad strength and normalizing gait pattern.     Diagnosis Orders   1. Chronic pain of right knee        2. Status post total knee replacement, right        3. Difficulty walking          Progress towards goals / Updated goals:    PLAN  []   Upgrade activities as tolerated     [x]   Continue plan of care  []   Discharge due to:_  []  Other:_      Dwyane Luo, PT 04/29/2022  11:41 AM

## 2022-05-04 ENCOUNTER — Ambulatory Visit
Admit: 2022-05-04 | Discharge: 2022-05-04 | Payer: PRIVATE HEALTH INSURANCE | Attending: Rehabilitative and Restorative Service Providers" | Primary: Family Medicine

## 2022-05-04 DIAGNOSIS — G8929 Other chronic pain: Secondary | ICD-10-CM

## 2022-05-04 NOTE — Progress Notes (Signed)
PT DAILY TREATMENT NOTE    Patient Name: Ronald Snow  Date:05/04/2022  DOB: 07/04/1966  [x]   Patient DOB Verified  Payor: Theme park manager / Plan: [place] / Product Type: *No Product type* /    Total Treatment Time (min): 55  Referring Provider: Zollie Beckers, MD    Treatment Area: R knee    SUBJECTIVE  Subjective functional status/changes:   []  No changes reported  Knee feels like it's getting a bit tight now, he mowed the grass and vacuumed earlier today. Otherwise doing well.    OBJECTIVE    Therapeutic Exercise: (minutes: 25)    PT Exercise Log        Activity/Exercise Date  05/04/22    Activity/Exercise      Nustep L2 10 min.   X   TKE X   Slantboard   X   Balance X   LAQs 5 LBS X                                 Manual Therapy: (minutes: 20) Treatment consisted of grade 3 patellofemoral and tibiofemoral mobilizations.  Soft tissue mobilization of R ITB and distal quad.      Neuromuscular Re-education: (minutes: 10) VMS for stimulation with active strengthening and tactile cueing to VMO in conjunction with quad sets and SAQ 2 LBS      ASSESSMENT  []   See Plan of Care  []   See progress note/recertification  [x]   Patient will continue to benefit from skilled therapy to address remaining functional deficits:     Doingn very well for this point in his recovery. Minimal complaints, doing heavier ADLs such as mowing the grass. Continue working on terminal extension during gait.     Diagnosis Orders   1. Chronic pain of right knee        2. Difficulty walking        3. Status post total knee replacement, right          Progress towards goals / Updated goals:    PLAN  []   Upgrade activities as tolerated     [x]   Continue plan of care  []   Discharge due to:_  []  Other:_      Belva Crome, PT 05/04/2022  1:30 PM

## 2022-05-06 ENCOUNTER — Ambulatory Visit
Admit: 2022-05-06 | Discharge: 2022-05-06 | Payer: PRIVATE HEALTH INSURANCE | Attending: Rehabilitative and Restorative Service Providers" | Primary: Family Medicine

## 2022-05-06 DIAGNOSIS — G8929 Other chronic pain: Secondary | ICD-10-CM

## 2022-05-06 NOTE — Progress Notes (Signed)
PT DAILY TREATMENT NOTE    Patient Name: Ronald Snow  Date:05/06/2022  DOB: [birth year]  [x]   Patient DOB Verified  Payor: Theme park manager / Plan: [place] / Product Type: *No Product type* /    Total Treatment Time (min): 55  Referring Provider: Zollie Beckers, MD    Treatment Area: R knee    SUBJECTIVE  Subjective functional status/changes:   []  No changes reported  A little stiff today but did well after first follow-up visit    OBJECTIVE    Therapeutic Exercise: (minutes: 25)    PT Exercise Log        Activity/Exercise Date  05/06/22    Activity/Exercise      Nustep L2 10 min.   X   TKE X   Slantboard   X   Balance X   LAQs 5 LBS X                                 Manual Therapy: (minutes: 20) Treatment consisted of grade 3 patellofemoral and tibiofemoral mobilizations.  Soft tissue mobilization of R ITB and distal quad.      Neuromuscular Re-education: (minutes: 10) VMS for stimulation with active strengthening and tactile cueing to VMO in conjunction with quad sets and SAQ 2 LBS      ASSESSMENT  []   See Plan of Care  []   See progress note/recertification  [x]   Patient will continue to benefit from skilled therapy to address remaining functional deficits:     Patient has excellent motion for this point in his recovery.  We will continue advancing strengthening next week     Diagnosis Orders   1. Chronic pain of right knee        2. Difficulty walking        3. Status post total knee replacement, right          Progress towards goals / Updated goals:    PLAN  []   Upgrade activities as tolerated     [x]   Continue plan of care  []   Discharge due to:_  []  Other:_      Belva Crome, PT 05/06/2022  11:23 AM

## 2022-05-11 ENCOUNTER — Ambulatory Visit
Admit: 2022-05-11 | Discharge: 2022-05-11 | Payer: PRIVATE HEALTH INSURANCE | Attending: Rehabilitative and Restorative Service Providers" | Primary: Family Medicine

## 2022-05-11 DIAGNOSIS — M25561 Pain in right knee: Secondary | ICD-10-CM

## 2022-05-11 NOTE — Progress Notes (Signed)
PT DAILY TREATMENT NOTE    Patient Name: Ronald Snow  Date:05/11/2022  DOB: 11-28-1965  [x]   Patient DOB Verified  Payor: Theme park manager / Plan: [place] / Product Type: *No Product type* /    Total Treatment Time (min): 55  Referring Provider: Zollie Beckers, MD    Treatment Area: R knee    SUBJECTIVE  Subjective functional status/changes:   []  No changes reported  Knee was very sore after walking on the beach.  Has had some burning nerve type pain since last visit but is resolving.    OBJECTIVE    Therapeutic Exercise: (minutes: 25)    PT Exercise Log        Activity/Exercise Date  05/11/22    Activity/Exercise      Nustep L2 10 min.   X   TKE X   Slantboard   X   Balance X   LAQs 5 LBS X   Leg press                              Manual Therapy: (minutes: 20) Treatment consisted of grade 3 patellofemoral and tibiofemoral mobilizations.  Soft tissue mobilization of R ITB and distal quad.      Neuromuscular Re-education: (minutes: 10) VMS for stimulation with active strengthening and tactile cueing to VMO in conjunction with quad sets and SAQ 2 LBS      ASSESSMENT  []   See Plan of Care  []   See progress note/recertification  [x]   Patient will continue to benefit from skilled therapy to address remaining functional deficits:     Patient returns after weekend trip to the beach.  No soreness while briefly walking in the sand but was expectedly sore afterwards.  Overall he is doing very well, still having some increased discomfort at times when increasing his level of activity i.e. riding a bike, walking on uneven ground.  Continue advancing strengthening later in the week     Diagnosis Orders   1. Chronic pain of right knee        2. Difficulty walking        3. Status post total knee replacement, right            Progress towards goals / Updated goals:    PLAN  []   Upgrade activities as tolerated     [x]   Continue plan of care  []   Discharge due to:_  []  Other:_      Belva Crome,  PT 05/11/2022  9:50 AM

## 2022-05-13 ENCOUNTER — Encounter
Admit: 2022-05-13 | Discharge: 2022-05-13 | Payer: PRIVATE HEALTH INSURANCE | Attending: Rehabilitative and Restorative Service Providers" | Primary: Family Medicine

## 2022-05-13 DIAGNOSIS — G8929 Other chronic pain: Secondary | ICD-10-CM

## 2022-05-13 NOTE — Progress Notes (Signed)
PT DAILY TREATMENT NOTE    Patient Name: Ronald Snow  Date:05/13/2022  DOB: [birth year]  [x]   Patient DOB Verified  Payor: Theme park manager / Plan: [place] / Product Type: *No Product type* /    Total Treatment Time (min): 55  Referring Provider: Zollie Beckers, MD    Treatment Area: R knee    SUBJECTIVE  Subjective functional status/changes:   []  No changes reported  Sore this morning. Went on a nice walk last night and felt good, felt some sharp nerve type pain this morning    OBJECTIVE    Therapeutic Exercise: (minutes: 25)    PT Exercise Log        Activity/Exercise Date  05/13/22    Activity/Exercise      Nustep L2 10 min.   X   TKE X   Slantboard   X   Balance X   LAQs 5 LBS X   Leg press X                             Manual Therapy: (minutes: 20) Treatment consisted of grade 3 patellofemoral and tibiofemoral mobilizations.  Soft tissue mobilization of R ITB and distal quad.      Neuromuscular Re-education: (minutes: 10) VMS for stimulation with active strengthening and tactile cueing to VMO in conjunction with SLR and SAQ 3 LBS      ASSESSMENT  []   See Plan of Care  []   See progress note/recertification  [x]   Patient will continue to benefit from skilled therapy to address remaining functional deficits:     Having occasional nerve pain in the anteromedial aspect but overall he is doing very well.  Continue advancing strengthening next week     Diagnosis Orders   1. Chronic pain of right knee        2. Difficulty walking        3. Status post total knee replacement, right            Progress towards goals / Updated goals:    PLAN  []   Upgrade activities as tolerated     [x]   Continue plan of care  []   Discharge due to:_  []  Other:_      Belva Crome, PT 05/13/2022  9:00 AM

## 2022-05-18 ENCOUNTER — Ambulatory Visit
Admit: 2022-05-18 | Discharge: 2022-05-18 | Payer: PRIVATE HEALTH INSURANCE | Attending: Rehabilitative and Restorative Service Providers" | Primary: Family Medicine

## 2022-05-18 DIAGNOSIS — G8929 Other chronic pain: Secondary | ICD-10-CM

## 2022-05-18 NOTE — Progress Notes (Signed)
PT DAILY TREATMENT NOTE    Patient Name: Ronald Snow  Date:05/18/2022  DOB: Jul 24, 1965  [x]   Patient DOB Verified  Payor: Theme park manager / Plan: [place] / Product Type: *No Product type* /    Total Treatment Time (min): 60  Referring Provider: Zollie Beckers, MD    Treatment Area: R knee    SUBJECTIVE  Subjective functional status/changes:   []  No changes reported  Sort of jumped on a shovel over the weekend and has been a little bit sore.  He is having no issues going up stairs now but is having difficulty going down    OBJECTIVE    Therapeutic Exercise: (minutes: 30)    PT Exercise Log        Activity/Exercise Date  05/18/22    Activity/Exercise      Nustep L2 10 min.   X   TKE X   Slantboard   X   Balance X   LAQs 5 LBS X   Leg press X   Lateral stepdown X                         Manual Therapy: (minutes: 20) Treatment consisted of grade 3 patellofemoral and tibiofemoral mobilizations.  Soft tissue mobilization of R ITB and distal quad.      Neuromuscular Re-education: (minutes: 10) VMS for stimulation with active strengthening and tactile cueing to VMO in conjunction with SLR and SAQ 3 LBS.  Balance board, lateral stepdown      ASSESSMENT  []   See Plan of Care  []   See progress note/recertification  [x]   Patient will continue to benefit from skilled therapy to address remaining functional deficits:     Slight increase in swelling noted since using his leg to jump on a shovel.  He was challenged with new lateral stepdown exercise to help with descending stairs.  Continue to follow later in the week     Diagnosis Orders   1. Chronic pain of right knee        2. Difficulty walking        3. Status post total knee replacement, right            Progress towards goals / Updated goals:    PLAN  []   Upgrade activities as tolerated     [x]   Continue plan of care  []   Discharge due to:_  []  Other:_      Belva Crome, PT 05/18/2022  9:16 AM

## 2022-05-20 ENCOUNTER — Ambulatory Visit
Admit: 2022-05-20 | Discharge: 2022-05-20 | Payer: PRIVATE HEALTH INSURANCE | Attending: Rehabilitative and Restorative Service Providers" | Primary: Family Medicine

## 2022-05-20 DIAGNOSIS — G8929 Other chronic pain: Secondary | ICD-10-CM

## 2022-05-20 NOTE — Progress Notes (Signed)
PT DAILY TREATMENT NOTE    Patient Name: Ronald Snow  Date:05/20/2022  DOB: 27-Feb-1966  [x]   Patient DOB Verified  Payor: Theme park manager / Plan: [place] / Product Type: *No Product type* /    Total Treatment Time (min): 60  Referring Provider: Zollie Beckers, MD    Treatment Area: R knee    SUBJECTIVE  Subjective functional status/changes:   []  No changes reported  Knee is doing well, no complaints today.  He has been doing new lateral stepdown exercise at home    OBJECTIVE    Therapeutic Exercise: (minutes: 30)    PT Exercise Log        Activity/Exercise Date  05/20/22    Activity/Exercise      Nustep L2 10 min.   X   TKE X   Slantboard   X   Balance X   LAQs 5 LBS X   Leg press X   Lateral stepdown X                         Manual Therapy: (minutes: 20) Treatment consisted of grade 3 patellofemoral and tibiofemoral mobilizations.  Soft tissue mobilization of R ITB and distal quad.      Neuromuscular Re-education: (minutes: 10) VMS for stimulation with active strengthening and tactile cueing to VMO in conjunction with SLR and SAQ 3 LBS.  Balance board, lateral stepdown      ASSESSMENT  []   See Plan of Care  []   See progress note/recertification  [x]   Patient will continue to benefit from skilled therapy to address remaining functional deficits:     Improved swelling today.  Overall he is doing excellent.  We discussed reducing visit frequency to once a week     Diagnosis Orders   1. Chronic pain of right knee        2. Difficulty walking        3. Status post total knee replacement, right              Progress towards goals / Updated goals:    PLAN  []   Upgrade activities as tolerated     [x]   Continue plan of care  []   Discharge due to:_  []  Other:_      Belva Crome, PT 05/20/2022  10:43 AM

## 2022-05-26 ENCOUNTER — Ambulatory Visit
Admit: 2022-05-26 | Discharge: 2022-05-26 | Payer: PRIVATE HEALTH INSURANCE | Attending: Rehabilitative and Restorative Service Providers" | Primary: Family Medicine

## 2022-05-26 DIAGNOSIS — G8929 Other chronic pain: Secondary | ICD-10-CM

## 2022-05-26 NOTE — Progress Notes (Signed)
PT DAILY TREATMENT NOTE    Patient Name: Ronald Snow  Date:05/26/2022  DOB: [birth year]  [x]   Patient DOB Verified  Payor: Theme park manager / Plan: [place] / Product Type: *No Product type* /    Total Treatment Time (min): 60  Referring Provider: Zollie Beckers, MD    Treatment Area: R knee    SUBJECTIVE  Subjective functional status/changes:   []  No changes reported  Has been walking 1 to 2 miles on a daily basis without any problems, just notices some swelling later.    OBJECTIVE    Therapeutic Exercise: (minutes: 30)    PT Exercise Log        Activity/Exercise Date  05/26/22    Activity/Exercise      Nustep L2 10 min.   X   TKE X   Slantboard   X   Balance X   LAQs 5 LBS X   Leg press X   Lateral stepdown X                         Manual Therapy: (minutes: 20) Treatment consisted of grade 3 patellofemoral and tibiofemoral mobilizations.  Soft tissue mobilization of R ITB and distal quad.      Neuromuscular Re-education: (minutes: 10) VMS for stimulation with active strengthening and tactile cueing to VMO in conjunction with SLR and SAQ 3 LBS.  Balance board, lateral stepdown      ASSESSMENT  []   See Plan of Care  []   See progress note/recertification  [x]   Patient will continue to benefit from skilled therapy to address remaining functional deficits:     Overall patient is doing great for 2 months postop.  He has regained full range of motion.  Does still have some eccentric quad weakness with lateral stepdown but is improving.  He is doing this exercise at home as well.  I feel he will be ready for discharge to home program over the next couple of visits.     Diagnosis Orders   1. Chronic pain of right knee        2. Difficulty walking        3. Status post total knee replacement, right              Progress towards goals / Updated goals:    PLAN  []   Upgrade activities as tolerated     [x]   Continue plan of care  []   Discharge due to:_  []  Other:_      Belva Crome,  PT 05/26/2022  9:37 AM

## 2022-06-02 ENCOUNTER — Encounter
Admit: 2022-06-02 | Discharge: 2022-06-02 | Payer: PRIVATE HEALTH INSURANCE | Attending: Rehabilitative and Restorative Service Providers" | Primary: Family Medicine

## 2022-06-02 DIAGNOSIS — G8929 Other chronic pain: Secondary | ICD-10-CM

## 2022-06-02 NOTE — Progress Notes (Signed)
PT DAILY TREATMENT NOTE    Patient Name: Ronald Snow  Date:06/02/2022  DOB: [birth year]  [x]   Patient DOB Verified  Payor: Theme park manager / Plan: [place] / Product Type: *No Product type* /    Total Treatment Time (min): 60  Referring Provider: Zollie Beckers, MD    Treatment Area: R knee    SUBJECTIVE  Subjective functional status/changes:   []  No changes reported  Patient has been trying to wean himself off ibuprofen.  Today is his last scheduled visit    OBJECTIVE    Therapeutic Exercise: (minutes: 30)    PT Exercise Log        Activity/Exercise Date  06/02/22    Activity/Exercise      Nustep L2 10 min.   X   TKE X   Slantboard   X   Balance X   LAQs 5 LBS X   Leg press X   Lateral stepdown X                         Manual Therapy: (minutes: 20) Treatment consisted of grade 3 patellofemoral and tibiofemoral mobilizations.  Soft tissue mobilization of R ITB and distal quad.      Neuromuscular Re-education: (minutes: 10) VMS for stimulation with active strengthening and tactile cueing to VMO in conjunction with SLR and SAQ 3 LBS.  Balance board, lateral stepdown      ASSESSMENT  [x]   See Plan of Care  []   See progress note/recertification  []   Patient will continue to benefit from skilled therapy to address remaining functional deficits:     Patient has done very well following right TKR.  He has regained full range of motion, ambulating without a limp.  Has remained diligent with his home strengthening exercises.  Feels appropriate to discharge him to home program at this time.  No future visits scheduled.     Diagnosis Orders   1. Chronic pain of right knee        2. Difficulty walking        3. Status post total knee replacement, right              PLAN  []   Upgrade activities as tolerated     []   Continue plan of care  [x]   Discharge   []  Other:_      Belva Crome, PT 06/02/2022  9:41 AM

## 2022-06-22 ENCOUNTER — Encounter: Payer: PRIVATE HEALTH INSURANCE | Attending: Physician Assistant | Primary: Family Medicine

## 2022-06-23 ENCOUNTER — Encounter: Payer: PRIVATE HEALTH INSURANCE | Attending: Specialist | Primary: Family Medicine

## 2022-06-23 ENCOUNTER — Ambulatory Visit
Admit: 2022-06-23 | Discharge: 2022-06-23 | Payer: PRIVATE HEALTH INSURANCE | Attending: Specialist | Primary: Family Medicine

## 2022-06-23 DIAGNOSIS — Z96652 Presence of left artificial knee joint: Secondary | ICD-10-CM

## 2022-06-23 NOTE — Progress Notes (Signed)
Ronald Snow (DOB: 1965/07/29) is a 56 y.o. male, patient, here for evaluation of the following chief complaint(s):  Post-Op Check (Second post op eval - RTKA 03/29/2022 at [facility] )       HPI:    Follow-up after joint replacement.  Doing well.  No issues.    Allergies   Allergen Reactions    Ace Inhibitors Cough       Current Outpatient Medications   Medication Sig Dispense Refill    losartan-hydroCHLOROthiazide (HYZAAR) 100-25 MG per tablet Take 1 tablet by mouth daily      atorvastatin (LIPITOR) 40 MG tablet Take 1 tablet by mouth daily      rosuvastatin (CRESTOR) 20 MG tablet Take 1 tablet by mouth daily (Patient not taking: Reported on 06/23/2022)      aspirin 325 MG tablet Take 1 tablet by mouth in the morning and at bedtime (Patient not taking: Reported on 06/23/2022) 60 tablet 0    sennosides-docusate sodium (SENOKOT-S) 8.6-50 MG tablet Take 1 tablet by mouth daily (Patient not taking: Reported on 06/23/2022) 30 tablet 1    valsartan (DIOVAN) 160 MG tablet Take 1 tablet by mouth daily (Patient not taking: Reported on 06/23/2022)       No current facility-administered medications for this visit.       Past Medical History:   Diagnosis Date    Hx of appendectomy 2010    Hypercholesteremia 2014    Hypertension     Knee joint replacement status 2013        Past Surgical History:   Procedure Laterality Date    ABDOMEN SURGERY  2010    Apendectomy    APPENDECTOMY      HAND SURGERY  1988    Right hand fracture - Pins    TOTAL KNEE ARTHROPLASTY Right 03/29/2022    by Dr. Durenda Age at [facility])    TOTAL KNEE ARTHROPLASTY Left 2013    UROLOGICAL SURGERY      VASECTOMY  2000       Family History   Problem Relation Age of Onset    Heart Disease Paternal Grandfather     Hypertension Paternal Grandfather     Hypertension Father     Hypertension Mother         Social History     Socioeconomic History    Marital status: Married     Spouse name: Not on file    Number of children: Not on file    Years of  education: Not on file    Highest education level: Not on file   Occupational History    Not on file   Tobacco Use    Smoking status: Never     Passive exposure: Never    Smokeless tobacco: Never   Substance and Sexual Activity    Alcohol use: Yes     Alcohol/week: 2.0 standard drinks of alcohol     Types: 2 Cans of beer per week    Drug use: Not Currently    Sexual activity: Not Currently     Partners: Female   Other Topics Concern    Not on file   Social History Narrative    Not on file     Social Determinants of Health     Financial Resource Strain: Not on file   Food Insecurity: Not on file   Transportation Needs: Not on file   Physical Activity: Not on file   Stress: Not on file   Social  Connections: Not on file   Intimate Partner Violence: Not on file   Housing Stability: Not on file             Vitals:    There were no vitals filed for this visit.  There is no height or weight on file to calculate BMI.    PHYSICAL EXAM:  On exam range 0 to 130 degrees.  PCL intact.    IMAGING:  Xray Result (most recent):  XR KNEE RIGHT (3 VIEWS) 04/23/2022    Narrative  Right knee 3 views.  Well-positioned cementless total knee.  No issues are noted.        ASSESSMENT/PLAN:  1. Status post left knee replacement  2. Status post total knee replacement, right    Doing well post left total knee.  Doing well post right total knee.  Follow-up 1 year to recheck both.  X-rays on the next office visit both knees.    An electronic signature was used to authenticate this note.  --Zollie Beckers, MD

## 2022-07-27 ENCOUNTER — Encounter

## 2023-03-29 ENCOUNTER — Encounter: Payer: PRIVATE HEALTH INSURANCE | Attending: Specialist | Primary: Family Medicine

## 2023-03-29 ENCOUNTER — Ambulatory Visit: Admit: 2023-03-29 | Payer: PRIVATE HEALTH INSURANCE | Primary: Family Medicine

## 2023-03-29 ENCOUNTER — Ambulatory Visit
Admit: 2023-03-29 | Discharge: 2023-03-29 | Payer: PRIVATE HEALTH INSURANCE | Attending: Physician Assistant | Primary: Family Medicine

## 2023-03-29 DIAGNOSIS — Z96651 Presence of right artificial knee joint: Secondary | ICD-10-CM

## 2023-03-29 NOTE — Progress Notes (Signed)
 Ronald Snow (DOB: 11/12/1965) is a 57 y.o. male,  here for evaluation of the following chief complaint(s):  No chief complaint on file.       SUBJECTIVE/OBJECTIVE:    Ronald Snow (DOB: 01-May-1966) is a 57 y.o. male.        Patient follows up today approximately 1 year out from a right total knee arthroplasty.  Patient states he is overall doing well following this procedure.  Patient is back to doing most every day activities that he would like to be doing.  Patient's range of motion is to his satisfaction.  Patient continues to perform home exercises to maintain strength.  Patient is no longer needing to take any medication for pain.      Allergies   Allergen Reactions    Ace Inhibitors Cough       Current Outpatient Medications   Medication Sig Dispense Refill    rosuvastatin (CRESTOR) 20 MG tablet Take 1 tablet by mouth daily (Patient not taking: Reported on 06/23/2022)      losartan-hydroCHLOROthiazide (HYZAAR) 100-25 MG per tablet Take 1 tablet by mouth daily      atorvastatin (LIPITOR) 40 MG tablet Take 1 tablet by mouth daily      valsartan (DIOVAN) 160 MG tablet Take 1 tablet by mouth daily (Patient not taking: Reported on 06/23/2022)       No current facility-administered medications for this visit.        Social History     Socioeconomic History    Marital status: Married     Spouse name: Not on file    Number of children: Not on file    Years of education: Not on file    Highest education level: Not on file   Occupational History    Not on file   Tobacco Use    Smoking status: Never     Passive exposure: Never    Smokeless tobacco: Never   Substance and Sexual Activity    Alcohol use: Yes     Alcohol/week: 2.0 standard drinks of alcohol     Types: 2 Cans of beer per week    Drug use: Not Currently    Sexual activity: Not Currently     Partners: Female   Other Topics Concern    Not on file   Social History Narrative    Not on file     Social Determinants of Health     Financial Resource Strain:  Not on file   Food Insecurity: Not on file   Transportation Needs: Not on file   Physical Activity: Not on file   Stress: Not on file   Social Connections: Not on file   Intimate Partner Violence: Not on file   Housing Stability: Not on file       Past Surgical History:   Procedure Laterality Date    ABDOMEN SURGERY  2010    Apendectomy    APPENDECTOMY      HAND SURGERY  1988    Right hand fracture - Pins    TOTAL KNEE ARTHROPLASTY Right 03/29/2022    zimmer implant - [facility] by Dr. Donnice Harness at [facility]    TOTAL KNEE ARTHROPLASTY Left 2013    UROLOGICAL SURGERY      VASECTOMY  2000       Family History   Problem Relation Age of Onset    Heart Disease Paternal Grandfather     Hypertension Paternal Grandfather     Hypertension Father  Hypertension Mother                REVIEW OF SYSTEMS:    Patient denies any recent fever, chills, nausea, vomiting, chest pain, or shortness of breath.      Vitals:    There were no vitals taken for this visit.    There is no height or weight on file to calculate BMI.       PHYSICAL EXAM:    The patient is alert and oriented x3 and in no acute distress.  The postoperative wound is well-healed without erythema or drainage.  Range of motion is 0-125 degrees.  The knee is stable to varus and valgus stress. There is no calf tenderness to palpation.  Distal motor and sensation is intact.      IMAGING:    Xray Result (most recent):  XR KNEE RIGHT (3 VIEWS) 03/29/2023    Narrative  AP, lateral and sunrise view digital radiographs of the right knee obtained in the office today were reviewed and demonstrate anatomic alignment of components with no evidence of hardware loosening or subsidence.         Orders Placed This Encounter   Procedures    XR KNEE RIGHT (3 VIEWS)     2A     Standing Status:   Future     Number of Occurrences:   1     Standing Expiration Date:   03/28/2024          ASSESSMENT/PLAN:      1. S/P total knee arthroplasty, right  -     XR KNEE RIGHT (3 VIEWS);  Future        Below is the assessment and plan developed based on review of pertinent history, physical exam, labs, studies, and medications.    57 y.o. male approximately 1 year status post right total knee arthroplasty.  I am happy with his progress so far following this procedure.  I have reviewed his x-rays with him in detail today and answered his questions to his satisfaction.  I emphasized the importance on continuing to do a home exercise program to maintain range of motion in the joint and strength in the leg. Mr. Neisler will follow up with us  as needed.      No follow-ups on file.        Dr. Kirby was available for immediate consultation as needed.     An electronic signature was used to authenticate this note.  -- Rollene Sor, PA-C

## 2023-04-14 NOTE — Telephone Encounter (Signed)
 Patient called and needs to know what the dental protocol is. He had surgery last year and wanted to get his teeth cleaned but they need to know if he is pre-med? He stated he was not prescribed medication.   CB# [phone]
